# Patient Record
Sex: Female | Born: 1935 | Race: White | Hispanic: No | Marital: Married | Smoking: Never smoker
Health system: Southern US, Community
[De-identification: ages and names within clinical notes are randomized; demographics above are authoritative.]

## PROBLEM LIST (undated history)

## (undated) DIAGNOSIS — I10 Essential (primary) hypertension: Secondary | ICD-10-CM

## (undated) HISTORY — PX: WHIPPLE PROCEDURE: SHX2667

---

## 2014-04-27 DIAGNOSIS — K219 Gastro-esophageal reflux disease without esophagitis: Secondary | ICD-10-CM | POA: Insufficient documentation

## 2014-05-10 DIAGNOSIS — K5790 Diverticulosis of intestine, part unspecified, without perforation or abscess without bleeding: Secondary | ICD-10-CM | POA: Insufficient documentation

## 2014-05-17 DIAGNOSIS — G629 Polyneuropathy, unspecified: Secondary | ICD-10-CM | POA: Insufficient documentation

## 2014-05-28 DIAGNOSIS — C329 Malignant neoplasm of larynx, unspecified: Secondary | ICD-10-CM | POA: Insufficient documentation

## 2014-10-11 DIAGNOSIS — K589 Irritable bowel syndrome without diarrhea: Secondary | ICD-10-CM | POA: Insufficient documentation

## 2016-12-12 ENCOUNTER — Emergency Department (HOSPITAL_BASED_OUTPATIENT_CLINIC_OR_DEPARTMENT_OTHER)
Admission: EM | Admit: 2016-12-12 | Discharge: 2016-12-12 | Disposition: A | Discharge status: Home / Self Care | Payer: Medicare Other | Attending: Dermatology | Admitting: Dermatology

## 2016-12-12 ENCOUNTER — Encounter (HOSPITAL_BASED_OUTPATIENT_CLINIC_OR_DEPARTMENT_OTHER): Payer: Self-pay | Admitting: Emergency Medicine

## 2016-12-12 DIAGNOSIS — Z5321 Procedure and treatment not carried out due to patient leaving prior to being seen by health care provider: Secondary | ICD-10-CM | POA: Insufficient documentation

## 2016-12-12 DIAGNOSIS — K5900 Constipation, unspecified: Secondary | ICD-10-CM | POA: Insufficient documentation

## 2016-12-12 NOTE — ED Triage Notes (Signed)
Pt reports constipation since 2 days ago.  Pt denies abd pain. Pt took laxatives and stool softeners without relief.

## 2018-07-01 ENCOUNTER — Emergency Department (HOSPITAL_BASED_OUTPATIENT_CLINIC_OR_DEPARTMENT_OTHER)
Admission: EM | Admit: 2018-07-01 | Discharge: 2018-07-01 | Disposition: A | Discharge status: Home / Self Care | Payer: Medicare Other | Attending: Emergency Medicine | Admitting: Emergency Medicine

## 2018-07-01 ENCOUNTER — Other Ambulatory Visit: Payer: Self-pay

## 2018-07-01 ENCOUNTER — Emergency Department (HOSPITAL_BASED_OUTPATIENT_CLINIC_OR_DEPARTMENT_OTHER): Payer: Medicare Other

## 2018-07-01 ENCOUNTER — Encounter (HOSPITAL_BASED_OUTPATIENT_CLINIC_OR_DEPARTMENT_OTHER): Payer: Self-pay | Admitting: Emergency Medicine

## 2018-07-01 DIAGNOSIS — S0003XA Contusion of scalp, initial encounter: Secondary | ICD-10-CM | POA: Diagnosis not present

## 2018-07-01 DIAGNOSIS — I10 Essential (primary) hypertension: Secondary | ICD-10-CM | POA: Insufficient documentation

## 2018-07-01 DIAGNOSIS — W19XXXA Unspecified fall, initial encounter: Secondary | ICD-10-CM

## 2018-07-01 DIAGNOSIS — Y999 Unspecified external cause status: Secondary | ICD-10-CM | POA: Diagnosis not present

## 2018-07-01 DIAGNOSIS — Z23 Encounter for immunization: Secondary | ICD-10-CM | POA: Diagnosis not present

## 2018-07-01 DIAGNOSIS — S2232XA Fracture of one rib, left side, initial encounter for closed fracture: Secondary | ICD-10-CM | POA: Diagnosis not present

## 2018-07-01 DIAGNOSIS — R911 Solitary pulmonary nodule: Secondary | ICD-10-CM

## 2018-07-01 DIAGNOSIS — Y929 Unspecified place or not applicable: Secondary | ICD-10-CM | POA: Insufficient documentation

## 2018-07-01 DIAGNOSIS — Z79899 Other long term (current) drug therapy: Secondary | ICD-10-CM | POA: Insufficient documentation

## 2018-07-01 DIAGNOSIS — Y9389 Activity, other specified: Secondary | ICD-10-CM | POA: Insufficient documentation

## 2018-07-01 DIAGNOSIS — S0990XA Unspecified injury of head, initial encounter: Secondary | ICD-10-CM | POA: Diagnosis present

## 2018-07-01 DIAGNOSIS — W101XXA Fall (on)(from) sidewalk curb, initial encounter: Secondary | ICD-10-CM | POA: Diagnosis not present

## 2018-07-01 HISTORY — DX: Essential (primary) hypertension: I10

## 2018-07-01 MED ORDER — TETANUS-DIPHTH-ACELL PERTUSSIS 5-2.5-18.5 LF-MCG/0.5 IM SUSP
0.5000 mL | Freq: Once | INTRAMUSCULAR | Status: AC
  Administered 2018-07-01: 0.5 mL via INTRAMUSCULAR
  Filled 2018-07-01: qty 0.5

## 2018-07-01 NOTE — ED Triage Notes (Signed)
Pt was walking into restaurant and tripped on the curb.  Pt fell, did hit her head but no LOC.  Pt has hematoma and small laceration to head.  Skin tear to left arm.  Pt also c/o left rib soreness.

## 2018-07-01 NOTE — ED Notes (Signed)
Pt ambulated by herself without any issues

## 2018-07-01 NOTE — ED Provider Notes (Signed)
[facility] EMERGENCY DEPARTMENT Provider Note   CSN: [id number] Arrival date & time: 07/01/18  1109     History   Chief Complaint Chief Complaint  Patient presents with  . Fall    HPI Pam Boone is a 84 y.o. female.  The history is provided by the patient. No language interpreter was used.  Fall    Pam Boone is a 84 y.o. female who presents to the Emergency Department complaining of fall. She presents to the emergency department for evaluation of injuries following a mechanical fall. She is getting out of her car to go to breakfast when she tripped over the curb and fell forward, striking the left side of her head. She reports pain to the left side of her head. No loss of consciousness, nausea, vomiting. She also states that she thinks she hit her left chest in her left arm. She has a history of hypertension, does not take any anticoagulants. Past Medical History:  Diagnosis Date  . Hypertension     There are no active problems to display for this patient.   Past Surgical History:  Procedure Laterality Date  . WHIPPLE PROCEDURE       OB History   None      Home Medications    Prior to Admission medications   Medication Sig Start Date End Date Taking? Authorizing Provider  potassium chloride (K-DUR,KLOR-CON) 10 MEQ tablet Take by mouth. 05/15/17  Yes [provider]  buPROPion (WELLBUTRIN XL) 150 MG 24 hr tablet  05/12/18   [provider]  triamterene-hydrochlorothiazide (MAXZIDE-25) 37.5-25 MG tablet  06/09/18   [provider]    Family History No family history on file.  Social History Social History   Tobacco Use  . Smoking status: Never Smoker  . Smokeless tobacco: Never Used  Substance Use Topics  . Alcohol use: No  . Drug use: No     Allergies   Bactrim [sulfamethoxazole-trimethoprim]   Review of Systems Review of Systems  All other systems reviewed and are negative.    Physical Exam Updated  Vital Signs BP (!) 132/111 (BP Location: Right Arm)   Pulse 66   Temp 98.3 F (36.8 C)   Resp 20   Ht 5\' 2"  (1.575 m)   Wt 43.1 kg (95 lb)   SpO2 100%   BMI 17.38 kg/m   Physical Exam  Constitutional: She is oriented to person, place, and time. She appears well-developed and well-nourished.  HENT:  Head: Normocephalic.  Abrasion and hematoma to the left parietal scalp with local tenderness to palpation.    Cardiovascular: Normal rate and regular rhythm.  No murmur heard. Pulmonary/Chest: Effort normal and breath sounds normal. No respiratory distress.  Mild tenderness to palpation over left lower anteriolateral ribs without abrasion or ecchymosis.    Abdominal: Soft. There is no tenderness. There is no rebound and no guarding.  Musculoskeletal:  Skin tear to left lateral elbow without local tenderness, ROM intact at elbow, shoulder.    Neurological: She is alert and oriented to person, place, and time.  Skin: Skin is warm and dry.  Psychiatric: She has a normal mood and affect. Her behavior is normal.  Nursing note and vitals reviewed.    ED Treatments / Results  Labs (all labs ordered are listed, but only abnormal results are displayed) Labs Reviewed - No data to display  EKG None  Radiology Dg Ribs Unilateral W/chest Left  Result Date: 07/01/2018 CLINICAL DATA:  [place] today, now  with left rib pain EXAM: LEFT RIBS AND CHEST - 3+ VIEW COMPARISON:  None. FINDINGS: The lungs appear somewhat hyperaerated. Biapical pleural-parenchymal scarring is noted and there is a nodular opacity in the right mid upper lung of questionable significance. Comparison with prior or follow-up chest x-ray is recommended. If this persists then CT the chest may be warranted. Mediastinal and hilar contours are unremarkable. The heart is mildly enlarged. A small to moderate size hiatal hernia is present. Left rib detail films show possible fracture of the left anterior tenth rib. No other rib  abnormality is seen. IMPRESSION: 1. Possible nondisplaced fracture of the left anterior tenth rib. 2. Hyperaeration. Nodular opacity in the right upper mid lung. Recommend follow-up chest x-ray. 3. Small to moderate hiatal hernia. Electronically Signed   By: Ivar Drape M.D.   On: 07/01/2018 11:58   Ct Head Wo Contrast  Result Date: 07/01/2018 CLINICAL DATA:  Tripped and fell hitting the head, no loss of consciousness, small laceration and hematoma EXAM: CT HEAD WITHOUT CONTRAST TECHNIQUE: Contiguous axial images were obtained from the base of the skull through the vertex without intravenous contrast. COMPARISON:  None. FINDINGS: Brain: The ventricular system is prominent as are the cortical sulci, indicative of diffuse atrophy. The septum is midline in position. Fourth ventricle and basilar cisterns are unremarkable. Mild small vessel ischemic changes noted throughout the periventricular white matter. No hemorrhage, mass lesion, or acute infarction is seen. Vascular: No vascular abnormality is evident on this unenhanced study. Skull: On bone window images, there is scalp swelling over the left parietal region near the vertex. No underlying calvarial fracture is seen. Sinuses/Orbits: The paranasal sinuses appear well pneumatized. Other: None. IMPRESSION: 1. Mild small vessel ischemic change. No acute intracranial abnormality. 2. Small left parietal scalp hematoma.  No fracture. Electronically Signed   By: Ivar Drape M.D.   On: 07/01/2018 11:51    Procedures Procedures (including critical care time)  Medications Ordered in ED Medications  Tdap (BOOSTRIX) injection 0.5 mL (0.5 mLs Intramuscular Given 07/01/18 1301)     Initial Impression / Assessment and Plan / ED Course  I have reviewed the triage vital signs and the nursing notes.  Pertinent labs & imaging results that were available during my care of the patient were reviewed by me and considered in my medical decision making (see chart for  details).     Patient here for evaluation of injuries following a mechanical fall. She does have an abrasion to the left scalp, secondary to the arm. Imaging demonstrates no acute intracranial abnormality. No clinical evidence of fracture in the arm. She does have minimal left sided chest wall tenderness/pain. She has no splinting on respirations. Imaging does notes a nondisplaced rib fracture. Patient declines any pain medications in the department. Counseled patient on home care for injuries following a fall as well as rib fracture. Recommend Tylenol as needed for pain according to label instructions. Abdominal exam is benign, presentation is not consistent with significant intra-abdominal injury. Discussed return precautions and outpatient follow-up.  Final Clinical Impressions(s) / ED Diagnoses   Final diagnoses:  Fall, initial encounter  Contusion of scalp, initial encounter  Closed fracture of one rib of left side, initial encounter  Pulmonary nodule    ED Discharge Orders    None       Quintella Reichert, MD 07/01/18 1505

## 2018-07-01 NOTE — ED Notes (Signed)
Wound care completed by EMT Kayla per RN AMY

## 2020-01-01 ENCOUNTER — Ambulatory Visit: Payer: Medicare Other | Attending: Internal Medicine

## 2020-01-01 DIAGNOSIS — Z23 Encounter for immunization: Secondary | ICD-10-CM | POA: Insufficient documentation

## 2020-01-01 NOTE — Progress Notes (Signed)
   Covid-19 Vaccination Clinic  Name:  Pam Boone    MRN: [id number] DOB: 02/07/1933  01/01/2020  [name] was observed post Covid-19 immunization for 15 minutes without incidence. She was provided with Vaccine Information Sheet and instruction to access the V-Safe system.   [name] was instructed to call 911 with any severe reactions post vaccine: Marland Kitchen Difficulty breathing  . Swelling of your face and throat  . A fast heartbeat  . A bad rash all over your body  . Dizziness and weakness    Immunizations Administered    Name Date Dose VIS Date Route   Pfizer COVID-19 Vaccine 01/01/2020 10:07 AM 0.3 mL 11/20/2019 Intramuscular   Manufacturer: [place]   Lot: [id number]   [place]: [id number]

## 2020-01-19 ENCOUNTER — Other Ambulatory Visit: Payer: Self-pay

## 2020-01-19 ENCOUNTER — Emergency Department (HOSPITAL_BASED_OUTPATIENT_CLINIC_OR_DEPARTMENT_OTHER): Payer: Medicare Other

## 2020-01-19 ENCOUNTER — Encounter (HOSPITAL_BASED_OUTPATIENT_CLINIC_OR_DEPARTMENT_OTHER): Payer: Self-pay | Admitting: *Deleted

## 2020-01-19 ENCOUNTER — Emergency Department (HOSPITAL_BASED_OUTPATIENT_CLINIC_OR_DEPARTMENT_OTHER)
Admission: EM | Admit: 2020-01-19 | Discharge: 2020-01-19 | Disposition: A | Discharge status: Home / Self Care | Payer: Medicare Other | Attending: Emergency Medicine | Admitting: Emergency Medicine

## 2020-01-19 DIAGNOSIS — Z882 Allergy status to sulfonamides status: Secondary | ICD-10-CM | POA: Insufficient documentation

## 2020-01-19 DIAGNOSIS — I493 Ventricular premature depolarization: Secondary | ICD-10-CM | POA: Insufficient documentation

## 2020-01-19 DIAGNOSIS — Z79899 Other long term (current) drug therapy: Secondary | ICD-10-CM | POA: Diagnosis not present

## 2020-01-19 DIAGNOSIS — I10 Essential (primary) hypertension: Secondary | ICD-10-CM | POA: Insufficient documentation

## 2020-01-19 DIAGNOSIS — R0789 Other chest pain: Secondary | ICD-10-CM | POA: Diagnosis present

## 2020-01-19 DIAGNOSIS — R002 Palpitations: Secondary | ICD-10-CM

## 2020-01-19 LAB — BASIC METABOLIC PANEL
Anion gap: 9 (ref 5–15)
BUN: 12 mg/dL (ref 8–23)
CO2: 26 mmol/L (ref 22–32)
Calcium: 8.9 mg/dL (ref 8.9–10.3)
Chloride: 103 mmol/L (ref 98–111)
Creatinine, Ser: 0.79 mg/dL (ref 0.44–1.00)
GFR calc Af Amer: >60 mL/min (ref >60)
GFR calc non Af Amer: >60 mL/min (ref >60)
Glucose, Bld: 102 mg/dL — ABNORMAL HIGH (ref 70–99)
Potassium: 3.5 mmol/L (ref 3.5–5.1)
Sodium: 138 mmol/L (ref 135–145)

## 2020-01-19 LAB — CBC
HCT: 42.2 % (ref 36.0–46.0)
Hemoglobin: 13.8 g/dL (ref 12.0–15.0)
MCH: 31.4 pg (ref 26.0–34.0)
MCHC: 32.7 g/dL (ref 30.0–36.0)
MCV: 95.9 fL (ref 80.0–100.0)
Platelets: 255 10*3/uL (ref 150–400)
RBC: 4.4 MIL/uL (ref 3.87–5.11)
RDW: 12.7 % (ref 11.5–15.5)
WBC: 5.3 10*3/uL (ref 4.0–10.5)
nRBC: 0 % (ref 0.0–0.2)

## 2020-01-19 LAB — TROPONIN I (HIGH SENSITIVITY): Troponin I (High Sensitivity): 3 ng/L (ref <18)

## 2020-01-19 NOTE — ED Triage Notes (Signed)
Chest pain on and off for a while. States at times she feels like her heart is skipping a beat.

## 2020-01-19 NOTE — ED Notes (Signed)
Pt presents with "I feel like my heart is skipping beats", states does have some chest pain at left breast area, but not often.

## 2020-01-19 NOTE — Discharge Instructions (Signed)
Recommend following up both with your primary doctor and a cardiologist.  If you would like to stay within the [facility] system and in [facility], then you should contact your primary doctor to discuss placing referral.  If you would like to follow-up with a [facility] provider, then call the number provided.  Recommend discussing with your primary doctor and cardiologist obtaining heart monitor to further assess your heart rhythm.  The EKG today showed sinus rhythm (normal) with an occasional PVC (extra beat).  If you develop worsening chest pain, difficulty breathing, other new concerning symptom, return to ER for reassessment.

## 2020-01-19 NOTE — ED Provider Notes (Signed)
[facility] Provider Note   CSN: [id number] Arrival date & time: 01/19/20  1258     History Chief Complaint  Patient presents with  . Chest Pain    Pam Boone is a 86 y.o. female.  Presents to ER with complaint of sensation of heart skipping beats, palpitations.  States symptoms have been going on for the past month or 2.  Does not have frank pain but states she has a funny feeling in her heart, feels like her heart occasionally skips beats.  Seems to occur at random, not constant.  Not associated with exertion.  No association with shortness of breath.  Past medical history hypertension, denies prior history CAD.  Has not been evaluated by cardiology previously.  Called her primary doctor to discuss and was referred to ER.  HPI     Past Medical History:  Diagnosis Date  . Hypertension     There are no problems to display for this patient.   Past Surgical History:  Procedure Laterality Date  . WHIPPLE PROCEDURE       OB History   No obstetric history on file.     No family history on file.  Social History   Tobacco Use  . Smoking status: Never Smoker  . Smokeless tobacco: Never Used  Substance Use Topics  . Alcohol use: No  . Drug use: No    Home Medications Prior to Admission medications   Medication Sig Start Date End Date Taking? Authorizing Provider  buPROPion (WELLBUTRIN XL) 150 MG 24 hr tablet  05/12/18   [provider]  potassium chloride (K-DUR,KLOR-CON) 10 MEQ tablet Take by mouth. 05/15/17   [provider]  triamterene-hydrochlorothiazide Bradd Burner) 37.5-25 MG tablet  06/09/18   [provider]    Allergies    Bactrim [sulfamethoxazole-trimethoprim]  Review of Systems   Review of Systems  Constitutional: Negative for chills and fever.  HENT: Negative for ear pain and sore throat.   Eyes: Negative for pain and visual disturbance.  Respiratory: Negative for cough and shortness of  breath.   Cardiovascular: Positive for palpitations. Negative for chest pain.  Gastrointestinal: Negative for abdominal pain and vomiting.  Genitourinary: Negative for dysuria and hematuria.  Musculoskeletal: Negative for arthralgias and back pain.  Skin: Negative for color change and rash.  Neurological: Negative for seizures and syncope.  All other systems reviewed and are negative.   Physical Exam Updated Vital Signs BP 136/79   Pulse 68   Temp 98.5 F (36.9 C) (Oral)   Resp 18   Ht 5\' 1"  (1.549 m)   Wt 43.1 kg   SpO2 98%   BMI 17.95 kg/m   Physical Exam Vitals and nursing note reviewed.  Constitutional:      General: She is not in acute distress.    Appearance: She is well-developed.  HENT:     Head: Normocephalic and atraumatic.  Eyes:     Conjunctiva/sclera: Conjunctivae normal.  Cardiovascular:     Rate and Rhythm: Normal rate and regular rhythm.     Heart sounds: No murmur.  Pulmonary:     Effort: Pulmonary effort is normal. No respiratory distress.     Breath sounds: Normal breath sounds.  Abdominal:     Palpations: Abdomen is soft.     Tenderness: There is no abdominal tenderness.  Musculoskeletal:     Cervical back: Neck supple.     Right lower leg: No tenderness. No edema.     Left lower  leg: No tenderness. No edema.  Skin:    General: Skin is warm and dry.     Capillary Refill: Capillary refill takes less than 2 seconds.  Neurological:     Mental Status: She is alert.     ED Results / Procedures / Treatments   Labs (all labs ordered are listed, but only abnormal results are displayed) Labs Reviewed  BASIC METABOLIC PANEL - Abnormal; Notable for the following components:      Result Value   Glucose, Bld 102 (*)    All other components within normal limits  CBC  TROPONIN I (HIGH SENSITIVITY)    EKG EKG Interpretation  Date/Time:  Tuesday January 19 2020 13:24:33 EST Ventricular Rate:  82 PR Interval:    QRS Duration: 87 QT  Interval:  374 QTC Calculation: 432 R Axis:   44 Text Interpretation: Sinus rhythm Ventricular premature complex Low voltage, extremity and precordial leads Confirmed by Madalyn Rob [phone]) on 01/19/2020 2:03:59 PM   Radiology DG Chest 2 View  Result Date: 01/19/2020 CLINICAL DATA:  Chest pain. Shortness of breath and chest tightness for weeks. EXAM: CHEST - 2 VIEW COMPARISON:  Chest radiograph 06/10/2019, additional priors FINDINGS: Chronic hyperinflation. The chronic nodular density in the right mid/upper lung zone is partially obscured by overlying monitoring device in the current exam, but grossly stable. The heart is normal in size. Stable aortic tortuosity. Retrocardiac hiatal hernia. No acute airspace disease, pulmonary edema, pleural effusion or pneumothorax. Calcified granuloma in the left lung again seen. Mild biapical pleuroparenchymal scarring. IMPRESSION: 1. No acute abnormality. 2. Chronic hyperinflation. 3. Retrocardiac hiatal hernia. Electronically Signed   By: Keith Rake M.D.   On: 01/19/2020 13:53    Procedures Procedures (including critical care time)  Medications Ordered in ED Medications - No data to display  ED Course  I have reviewed the triage vital signs and the nursing notes.  Pertinent labs & imaging results that were available during my care of the patient were reviewed by me and considered in my medical decision making (see chart for details).    MDM Rules/Calculators/A&P                      86 year old lady presenting to ER with many weeks of intermittent palpitations/sensation heart skipping beat.  On exam patient noted to be very well-appearing, she had normal vital signs.  EKG demonstrated normal sinus rhythm with occasional PVC.  Electrolytes within normal limits, blood counts within normal limits, troponin within normal limits.  Given duration of symptoms, description of symptoms, EKG and troponin, highly doubt ACS.  Symptoms may be related to  her PVC.  Recommend that she follow-up with her primary doctor as well as a cardiologist.  Recommended discussing outpatient Holter monitor.  At this time believe she is appropriate for further management as outpatient, will discharge home.  Reviewed precautions.    After the discussed management above, the patient was determined to be safe for discharge.  The patient was in agreement with this plan and all questions regarding their care were answered.  ED return precautions were discussed and the patient will return to the ED with any significant worsening of condition.    Final Clinical Impression(s) / ED Diagnoses Final diagnoses:  Palpitations  PVC (premature ventricular contraction)    Rx / DC Orders ED Discharge Orders    None       Lucrezia Starch, MD 01/19/20 [phone]

## 2020-01-22 ENCOUNTER — Ambulatory Visit: Payer: Medicare Other | Attending: Internal Medicine

## 2020-01-22 DIAGNOSIS — Z23 Encounter for immunization: Secondary | ICD-10-CM | POA: Insufficient documentation

## 2020-01-22 NOTE — Progress Notes (Signed)
   Covid-19 Vaccination Clinic  Name:  Pam Boone    MRN: [id number] DOB: [birth year]  01/22/2020  Pam Boone was observed post Covid-19 immunization for 15 minutes without incidence. She was provided with Vaccine Information Sheet and instruction to access the V-Safe system.   Pam Boone was instructed to call 911 with any severe reactions post vaccine: Marland Kitchen Difficulty breathing  . Swelling of your face and throat  . A fast heartbeat  . A bad rash all over your body  . Dizziness and weakness    Immunizations Administered    Name Date Dose VIS Date Route   Pfizer COVID-19 Vaccine 01/22/2020 11:16 AM 0.3 mL 11/20/2019 Intramuscular   Manufacturer: [place]   Lot: [id number]   [place]: [id number]

## 2020-05-10 DIAGNOSIS — G301 Alzheimer's disease with late onset: Secondary | ICD-10-CM | POA: Insufficient documentation

## 2020-05-10 DIAGNOSIS — F0280 Dementia in other diseases classified elsewhere without behavioral disturbance: Secondary | ICD-10-CM | POA: Insufficient documentation

## 2020-09-26 ENCOUNTER — Emergency Department (HOSPITAL_BASED_OUTPATIENT_CLINIC_OR_DEPARTMENT_OTHER)
Admission: EM | Admit: 2020-09-26 | Discharge: 2020-09-26 | Disposition: A | Discharge status: Home / Self Care | Payer: Medicare Other | Attending: Emergency Medicine | Admitting: Emergency Medicine

## 2020-09-26 ENCOUNTER — Emergency Department (HOSPITAL_BASED_OUTPATIENT_CLINIC_OR_DEPARTMENT_OTHER): Payer: Medicare Other

## 2020-09-26 ENCOUNTER — Other Ambulatory Visit: Payer: Self-pay

## 2020-09-26 ENCOUNTER — Encounter (HOSPITAL_BASED_OUTPATIENT_CLINIC_OR_DEPARTMENT_OTHER): Payer: Self-pay | Admitting: Emergency Medicine

## 2020-09-26 DIAGNOSIS — R0789 Other chest pain: Secondary | ICD-10-CM | POA: Diagnosis present

## 2020-09-26 DIAGNOSIS — I10 Essential (primary) hypertension: Secondary | ICD-10-CM | POA: Diagnosis not present

## 2020-09-26 DIAGNOSIS — Z79899 Other long term (current) drug therapy: Secondary | ICD-10-CM | POA: Insufficient documentation

## 2020-09-26 DIAGNOSIS — R079 Chest pain, unspecified: Secondary | ICD-10-CM

## 2020-09-26 LAB — CBC
HCT: 38.1 % (ref 36.0–46.0)
Hemoglobin: 12.5 g/dL (ref 12.0–15.0)
MCH: 31.1 pg (ref 26.0–34.0)
MCHC: 32.8 g/dL (ref 30.0–36.0)
MCV: 94.8 fL (ref 80.0–100.0)
Platelets: 277 10*3/uL (ref 150–400)
RBC: 4.02 MIL/uL (ref 3.87–5.11)
RDW: 13.2 % (ref 11.5–15.5)
WBC: 5.4 10*3/uL (ref 4.0–10.5)
nRBC: 0 % (ref 0.0–0.2)

## 2020-09-26 LAB — COMPREHENSIVE METABOLIC PANEL
ALT: 14 U/L (ref 0–44)
AST: 19 U/L (ref 15–41)
Albumin: 4 g/dL (ref 3.5–5.0)
Alkaline Phosphatase: 106 U/L (ref 38–126)
Anion gap: 7 (ref 5–15)
BUN: 14 mg/dL (ref 8–23)
CO2: 26 mmol/L (ref 22–32)
Calcium: 8.7 mg/dL — ABNORMAL LOW (ref 8.9–10.3)
Chloride: 100 mmol/L (ref 98–111)
Creatinine, Ser: 0.74 mg/dL (ref 0.44–1.00)
GFR, Estimated: >60 mL/min (ref >60)
Glucose, Bld: 110 mg/dL — ABNORMAL HIGH (ref 70–99)
Potassium: 4.1 mmol/L (ref 3.5–5.1)
Sodium: 133 mmol/L — ABNORMAL LOW (ref 135–145)
Total Bilirubin: 0.5 mg/dL (ref 0.3–1.2)
Total Protein: 7.1 g/dL (ref 6.5–8.1)

## 2020-09-26 LAB — TROPONIN I (HIGH SENSITIVITY)
Troponin I (High Sensitivity): 3 ng/L (ref <18)
Troponin I (High Sensitivity): 4 ng/L (ref <18)

## 2020-09-26 NOTE — ED Triage Notes (Addendum)
Left sided Cp since Friday some sob  No n/v or radiation, pt states pain comes and goes

## 2020-09-26 NOTE — Discharge Instructions (Addendum)
Please follow-up with your cardiologist regarding the episode of chest pain that you are experiencing today.  If you develop worsening chest pain, difficulty in breathing or other new concerning symptom, return to ER for reassessment.

## 2020-09-26 NOTE — ED Provider Notes (Signed)
[facility] EMERGENCY DEPARTMENT Provider Note   CSN: [id number] Arrival date & time: 09/26/20  1539     History Chief Complaint  Patient presents with  . Chest Pain    Pam Boone is a 86 y.o. female.  Presents here with concern for chest pain.  Patient reports Friday she started having some intermittent chest pain, mild discomfort on left upper chest wall.  Nonradiating.  Symptoms were more mild on Saturday and Sunday, last had some mild pain at rest this morning.  No association with exertion, no difficulty in breathing.  Has history of hypertension, denies any other chronic medical conditions.  Denies prior history of CAD.  HPI     Past Medical History:  Diagnosis Date  . Hypertension     There are no problems to display for this patient.   Past Surgical History:  Procedure Laterality Date  . WHIPPLE PROCEDURE       OB History   No obstetric history on file.     No family history on file.  Social History   Tobacco Use  . Smoking status: Never Smoker  . Smokeless tobacco: Never Used  Substance Use Topics  . Alcohol use: No  . Drug use: No    Home Medications Prior to Admission medications   Medication Sig Start Date End Date Taking? Authorizing Provider  buPROPion (WELLBUTRIN XL) 150 MG 24 hr tablet  05/12/18   [provider]  potassium chloride (K-DUR,KLOR-CON) 10 MEQ tablet Take by mouth. 05/15/17   [provider]  triamterene-hydrochlorothiazide Bradd Burner) 37.5-25 MG tablet  06/09/18   [provider]    Allergies    Bactrim [sulfamethoxazole-trimethoprim]  Review of Systems   Review of Systems  Constitutional: Negative for chills and fever.  HENT: Negative for ear pain and sore throat.   Eyes: Negative for pain and visual disturbance.  Respiratory: Negative for cough and shortness of breath.   Cardiovascular: Positive for chest pain. Negative for palpitations.  Gastrointestinal: Negative for abdominal  pain and vomiting.  Genitourinary: Negative for dysuria and hematuria.  Musculoskeletal: Negative for arthralgias and back pain.  Skin: Negative for color change and rash.  Neurological: Negative for seizures and syncope.  All other systems reviewed and are negative.   Physical Exam Updated Vital Signs BP 109/65   Pulse 64   Temp 97.7 F (36.5 C) (Oral)   Resp 19   Ht 5' (1.524 m)   Wt 41.7 kg   SpO2 98%   BMI 17.97 kg/m   Physical Exam Vitals and nursing note reviewed.  Constitutional:      General: She is not in acute distress.    Appearance: She is well-developed.  HENT:     Head: Normocephalic and atraumatic.  Eyes:     Conjunctiva/sclera: Conjunctivae normal.  Cardiovascular:     Rate and Rhythm: Normal rate and regular rhythm.     Heart sounds: No murmur heard.   Pulmonary:     Effort: Pulmonary effort is normal. No respiratory distress.     Breath sounds: Normal breath sounds.  Chest:     Chest wall: No mass, deformity, tenderness or crepitus.  Abdominal:     Palpations: Abdomen is soft.     Tenderness: There is no abdominal tenderness.  Musculoskeletal:     Cervical back: Neck supple.  Skin:    General: Skin is warm and dry.     Capillary Refill: Capillary refill takes less than 2 seconds.  Neurological:  General: No focal deficit present.     Mental Status: She is alert and oriented to person, place, and time.     ED Results / Procedures / Treatments   Labs (all labs ordered are listed, but only abnormal results are displayed) Labs Reviewed  COMPREHENSIVE METABOLIC PANEL - Abnormal; Notable for the following components:      Result Value   Sodium 133 (*)    Glucose, Bld 110 (*)    Calcium 8.7 (*)    All other components within normal limits  CBC  TROPONIN I (HIGH SENSITIVITY)  TROPONIN I (HIGH SENSITIVITY)    EKG EKG Interpretation  Date/Time:  Monday September 26 2020 15:49:44 EDT Ventricular Rate:  72 PR Interval:  144 QRS  Duration: 62 QT Interval:  378 QTC Calculation: 413 R Axis:   51 Text Interpretation: Normal sinus rhythm Low voltage QRS Borderline ECG Confirmed by Madalyn Rob [phone]) on 09/26/2020 5:14:21 PM   Radiology DG Chest 2 View  Result Date: 09/26/2020 CLINICAL DATA:  Chest pain EXAM: CHEST - 2 VIEW COMPARISON:  01/19/2020 FINDINGS: The heart size and mediastinal contours are within normal limits. No consolidation. Similar nodular opacity in the right upper lobe. Chronic hyperinflation. No visible pleural effusions or pneumothorax. Biapical pleuroparenchymal scarring. Redemonstrated calcified granuloma in the left lung. Moderate hiatal hernia. No acute osseous abnormality. Degenerative changes of the spine. Osteopenia. IMPRESSION: 1. No acute cardiopulmonary abnormality. 2. Chronic hyperinflation. 3. Moderate hiatal hernia. Electronically Signed   By: Margaretha Sheffield MD   On: 09/26/2020 16:13    Procedures Procedures (including critical care time)  Medications Ordered in ED Medications - No data to display  ED Course  I have reviewed the triage vital signs and the nursing notes.  Pertinent labs & imaging results that were available during my care of the patient were reviewed by me and considered in my medical decision making (see chart for details).    MDM Rules/Calculators/A&P                           86 year old lady presents to ER with concern for chest pain.  On physical exam patient noted to be remarkably well-appearing with normal vital signs.  Her EKG is without acute ischemic changes, troponin x2 within normal limits, no ongoing pain.  Given these findings, very low suspicion for ACS.  Her chest x-ray per my review and per radiology has no acute findings.  Remainder of labs were grossly stable.  Recommend that she follow-up with her primary doctor regarding the symptoms and reviewed return precautions in detail with daughter and son at bedside.  After the discussed  management above, the patient was determined to be safe for discharge.  The patient was in agreement with this plan and all questions regarding their care were answered.  ED return precautions were discussed and the patient will return to the ED with any significant worsening of condition.  Final Clinical Impression(s) / ED Diagnoses Final diagnoses:  Chest pain, unspecified type    Rx / DC Orders ED Discharge Orders    None       Lucrezia Starch, MD 09/27/20 0002

## 2022-06-11 ENCOUNTER — Inpatient Hospital Stay (HOSPITAL_BASED_OUTPATIENT_CLINIC_OR_DEPARTMENT_OTHER)
Admission: EM | Admit: 2022-06-11 | Discharge: 2022-06-21 | DRG: 178 | Disposition: A | Discharge status: Skilled Nursing Facility | Payer: Medicare Other | Attending: Internal Medicine | Admitting: Internal Medicine

## 2022-06-11 ENCOUNTER — Encounter (HOSPITAL_BASED_OUTPATIENT_CLINIC_OR_DEPARTMENT_OTHER): Payer: Self-pay | Admitting: Pediatrics

## 2022-06-11 ENCOUNTER — Emergency Department (HOSPITAL_BASED_OUTPATIENT_CLINIC_OR_DEPARTMENT_OTHER): Payer: Medicare Other

## 2022-06-11 ENCOUNTER — Other Ambulatory Visit: Payer: Self-pay

## 2022-06-11 DIAGNOSIS — Z923 Personal history of irradiation: Secondary | ICD-10-CM

## 2022-06-11 DIAGNOSIS — E861 Hypovolemia: Secondary | ICD-10-CM | POA: Diagnosis present

## 2022-06-11 DIAGNOSIS — F0390 Unspecified dementia without behavioral disturbance: Secondary | ICD-10-CM | POA: Diagnosis present

## 2022-06-11 DIAGNOSIS — E876 Hypokalemia: Secondary | ICD-10-CM | POA: Diagnosis not present

## 2022-06-11 DIAGNOSIS — Z803 Family history of malignant neoplasm of breast: Secondary | ICD-10-CM

## 2022-06-11 DIAGNOSIS — Z888 Allergy status to other drugs, medicaments and biological substances status: Secondary | ICD-10-CM

## 2022-06-11 DIAGNOSIS — U071 COVID-19: Principal | ICD-10-CM | POA: Diagnosis present

## 2022-06-11 DIAGNOSIS — Z7189 Other specified counseling: Secondary | ICD-10-CM

## 2022-06-11 DIAGNOSIS — E872 Acidosis, unspecified: Secondary | ICD-10-CM | POA: Diagnosis present

## 2022-06-11 DIAGNOSIS — R1310 Dysphagia, unspecified: Secondary | ICD-10-CM | POA: Diagnosis present

## 2022-06-11 DIAGNOSIS — E871 Hypo-osmolality and hyponatremia: Secondary | ICD-10-CM | POA: Diagnosis present

## 2022-06-11 DIAGNOSIS — K219 Gastro-esophageal reflux disease without esophagitis: Secondary | ICD-10-CM | POA: Diagnosis present

## 2022-06-11 DIAGNOSIS — R627 Adult failure to thrive: Secondary | ICD-10-CM | POA: Diagnosis present

## 2022-06-11 DIAGNOSIS — I10 Essential (primary) hypertension: Secondary | ICD-10-CM | POA: Diagnosis present

## 2022-06-11 DIAGNOSIS — Z79899 Other long term (current) drug therapy: Secondary | ICD-10-CM

## 2022-06-11 DIAGNOSIS — E860 Dehydration: Secondary | ICD-10-CM | POA: Diagnosis not present

## 2022-06-11 DIAGNOSIS — Z515 Encounter for palliative care: Secondary | ICD-10-CM

## 2022-06-11 DIAGNOSIS — E785 Hyperlipidemia, unspecified: Secondary | ICD-10-CM | POA: Diagnosis present

## 2022-06-11 DIAGNOSIS — E440 Moderate protein-calorie malnutrition: Secondary | ICD-10-CM | POA: Diagnosis present

## 2022-06-11 DIAGNOSIS — Z85819 Personal history of malignant neoplasm of unspecified site of lip, oral cavity, and pharynx: Secondary | ICD-10-CM

## 2022-06-11 DIAGNOSIS — R531 Weakness: Secondary | ICD-10-CM

## 2022-06-11 DIAGNOSIS — Z681 Body mass index (BMI) 19 or less, adult: Secondary | ICD-10-CM

## 2022-06-11 DIAGNOSIS — Z66 Do not resuscitate: Secondary | ICD-10-CM | POA: Diagnosis present

## 2022-06-11 LAB — CBC
HCT: 40.5 % (ref 36.0–46.0)
Hemoglobin: 14 g/dL (ref 12.0–15.0)
MCH: 32.4 pg (ref 26.0–34.0)
MCHC: 34.6 g/dL (ref 30.0–36.0)
MCV: 93.8 fL (ref 80.0–100.0)
Platelets: 184 10*3/uL (ref 150–400)
RBC: 4.32 MIL/uL (ref 3.87–5.11)
RDW: 13.8 % (ref 11.5–15.5)
WBC: 8.3 10*3/uL (ref 4.0–10.5)
nRBC: 0 % (ref 0.0–0.2)

## 2022-06-11 LAB — URINALYSIS, ROUTINE W REFLEX MICROSCOPIC
Bilirubin Urine: NEGATIVE
Glucose, UA: >=500 mg/dL — AB
Ketones, ur: NEGATIVE mg/dL
Leukocytes,Ua: NEGATIVE
Nitrite: NEGATIVE
Protein, ur: 100 mg/dL — AB
Specific Gravity, Urine: 1.025 (ref 1.005–1.030)
pH: 6 (ref 5.0–8.0)

## 2022-06-11 LAB — URINALYSIS, MICROSCOPIC (REFLEX): WBC, UA: NONE SEEN WBC/hpf (ref 0–5)

## 2022-06-11 LAB — BASIC METABOLIC PANEL
Anion gap: 9 (ref 5–15)
BUN: 33 mg/dL — ABNORMAL HIGH (ref 8–23)
CO2: 21 mmol/L — ABNORMAL LOW (ref 22–32)
Calcium: 8.6 mg/dL — ABNORMAL LOW (ref 8.9–10.3)
Chloride: 102 mmol/L (ref 98–111)
Creatinine, Ser: 0.86 mg/dL (ref 0.44–1.00)
GFR, Estimated: >60 mL/min (ref >60)
Glucose, Bld: 258 mg/dL — ABNORMAL HIGH (ref 70–99)
Potassium: 3.9 mmol/L (ref 3.5–5.1)
Sodium: 132 mmol/L — ABNORMAL LOW (ref 135–145)

## 2022-06-11 LAB — RESP PANEL BY RT-PCR (FLU A&B, COVID) ARPGX2
Influenza A by PCR: NEGATIVE
Influenza B by PCR: NEGATIVE
SARS Coronavirus 2 by RT PCR: POSITIVE — AB

## 2022-06-11 LAB — LACTIC ACID, PLASMA
Lactic Acid, Venous: 2.6 mmol/L (ref 0.5–1.9)
Lactic Acid, Venous: 2.4 mmol/L (ref 0.5–1.9)

## 2022-06-11 LAB — HEPATIC FUNCTION PANEL
ALT: 17 U/L (ref 0–44)
AST: 27 U/L (ref 15–41)
Albumin: 4 g/dL (ref 3.5–5.0)
Alkaline Phosphatase: 72 U/L (ref 38–126)
Bilirubin, Direct: 0.2 mg/dL (ref 0.0–0.2)
Indirect Bilirubin: 0.7 mg/dL (ref 0.3–0.9)
Total Bilirubin: 0.9 mg/dL (ref 0.3–1.2)
Total Protein: 7.6 g/dL (ref 6.5–8.1)

## 2022-06-11 LAB — TROPONIN I (HIGH SENSITIVITY)
Troponin I (High Sensitivity): 7 ng/L (ref <18)
Troponin I (High Sensitivity): 8 ng/L (ref <18)

## 2022-06-11 LAB — CBG MONITORING, ED: Glucose-Capillary: 279 mg/dL — ABNORMAL HIGH (ref 70–99)

## 2022-06-11 LAB — LIPASE, BLOOD: Lipase: 49 U/L (ref 11–51)

## 2022-06-11 MED ORDER — ACETAMINOPHEN 500 MG PO TABS
1000.0000 mg | ORAL_TABLET | Freq: Once | ORAL | Status: AC
Start: 2022-06-11 — End: 2022-06-11
  Administered 2022-06-11: 1000 mg via ORAL
  Filled 2022-06-11: qty 2

## 2022-06-11 MED ORDER — LACTATED RINGERS IV SOLN: INTRAVENOUS | Status: AC

## 2022-06-11 MED ORDER — CEFEPIME HCL 1 G IJ SOLR: 1.0000 g | Freq: Two times a day (BID) | INTRAMUSCULAR | Status: DC

## 2022-06-11 MED ORDER — SODIUM CHLORIDE 0.9 % IV SOLN
2.0000 g | Freq: Once | INTRAVENOUS | Status: DC
  Filled 2022-06-11: qty 12.5

## 2022-06-11 MED ORDER — METRONIDAZOLE 500 MG/100ML IV SOLN
500.0000 mg | Freq: Once | INTRAVENOUS | Status: AC
  Administered 2022-06-11: 500 mg via INTRAVENOUS
  Filled 2022-06-11: qty 100

## 2022-06-11 MED ORDER — VANCOMYCIN HCL 750 MG/150ML IV SOLN
750.0000 mg | INTRAVENOUS | Status: DC
  Filled 2022-06-11: qty 150

## 2022-06-11 MED ORDER — SODIUM CHLORIDE 0.9 % IV SOLN
2.0000 g | INTRAVENOUS | Status: DC
  Administered 2022-06-11 – 2022-06-12 (×2): 2 g via INTRAVENOUS
  Filled 2022-06-11: qty 12.5

## 2022-06-11 MED ORDER — VANCOMYCIN HCL IN DEXTROSE 1-5 GM/200ML-% IV SOLN
1000.0000 mg | Freq: Once | INTRAVENOUS | Status: AC
  Administered 2022-06-11: 1000 mg via INTRAVENOUS
  Filled 2022-06-11: qty 200

## 2022-06-11 NOTE — ED Notes (Signed)
Lactic acid and repeat troponin obtained and to the lab

## 2022-06-11 NOTE — ED Notes (Signed)
Patient's family left, this RN will call if patient gets a bed assigned overnight.

## 2022-06-11 NOTE — ED Notes (Signed)
Patient's family member requests that patient NOT be treated for covid when admitted.

## 2022-06-11 NOTE — ED Provider Notes (Signed)
Emergency Department Provider Note   I have reviewed the triage vital signs and the nursing notes.   HISTORY  Chief Complaint Weakness   HPI Pam Boone is a 88 y.o. female with PMH of HTN and dementia with h/o throat cancer s/p radiation presents to the ED with fever, AMS, and cough. Patient has had significant decreased PO intake in the last week and is more confused. Until the last several weeks the patient has been able to live independently with assistance from daughter and son-in-law. Patient not vomiting. No CP or SOB. No diarrhea. Patient denies HA, CP, abdominal pain, back pain.    Past Medical History:  Diagnosis Date   Hypertension     Review of Systems  Constitutional: Positive fever/chills and decreased oral intake.  Cardiovascular: Denies chest pain. Respiratory: Denies shortness of breath. Positive cough.  Gastrointestinal: No abdominal pain. No nausea, no vomiting.  No diarrhea.  No constipation. Genitourinary: Negative for dysuria. Musculoskeletal: Negative for back pain. Skin: Negative for rash. Neurological: Negative for headaches, focal weakness or numbness.  ____________________________________________   PHYSICAL EXAM:  VITAL SIGNS: ED Triage Vitals  Enc Vitals Group     BP 06/11/22 1554 (!) 99/54     Pulse Rate 06/11/22 1554 88     Resp 06/11/22 1554 20     Temp 06/11/22 1554 98.6 F (37 C)     Temp Source 06/11/22 1554 Oral     SpO2 06/11/22 1554 100 %     Weight 06/11/22 1558 81 lb (36.7 kg)     Height 06/11/22 1558 '5\' 2"'$  (1.575 m)   Constitutional: Alert with increased confusion per family but oriented for me to person, place, and time.  Eyes: Conjunctivae are normal.  Head: Atraumatic. Nose: No congestion/rhinnorhea. Mouth/Throat: Mucous membranes are dry.  Neck: No stridor.  Cardiovascular: Normal rate, regular rhythm. Good peripheral circulation. Grossly normal heart sounds.   Respiratory: Normal respiratory effort.  No  retractions. Lungs CTAB. Frequent coughing on exam.  Gastrointestinal: Soft and nontender. No distention.  Musculoskeletal: No lower extremity tenderness nor edema. No gross deformities of extremities. Neurologic:  Normal speech and language. No gross focal neurologic deficits are appreciated.  Skin:  Skin is warm, dry and intact. No rash noted. ____________________________________________   LABS (all labs ordered are listed, but only abnormal results are displayed)  Labs Reviewed  RESP PANEL BY RT-PCR (FLU A&B, COVID) ARPGX2 - Abnormal; Notable for the following components:      Result Value   SARS Coronavirus 2 by RT PCR POSITIVE (*)    All other components within normal limits  BASIC METABOLIC PANEL - Abnormal; Notable for the following components:   Sodium 132 (*)    CO2 21 (*)    Glucose, Bld 258 (*)    BUN 33 (*)    Calcium 8.6 (*)    All other components within normal limits  URINALYSIS, ROUTINE W REFLEX MICROSCOPIC - Abnormal; Notable for the following components:   Glucose, UA >=500 (*)    Hgb urine dipstick TRACE (*)    Protein, ur 100 (*)    All other components within normal limits  LACTIC ACID, PLASMA - Abnormal; Notable for the following components:   Lactic Acid, Venous 2.6 (*)    All other components within normal limits  LACTIC ACID, PLASMA - Abnormal; Notable for the following components:   Lactic Acid, Venous 2.4 (*)    All other components within normal limits  URINALYSIS, MICROSCOPIC (REFLEX) - Abnormal;  Notable for the following components:   Bacteria, UA RARE (*)    All other components within normal limits  CBG MONITORING, ED - Abnormal; Notable for the following components:   Glucose-Capillary 279 (*)    All other components within normal limits  CULTURE, BLOOD (ROUTINE X 2)  CULTURE, BLOOD (ROUTINE X 2)  CBC  HEPATIC FUNCTION PANEL  LIPASE, BLOOD  PROTIME-INR  CBG MONITORING, ED  TROPONIN I (HIGH SENSITIVITY)  TROPONIN I (HIGH SENSITIVITY)    ____________________________________________  EKG   EKG Interpretation  Date/Time:  Monday June 11 2022 16:10:54 EDT Ventricular Rate:  89 PR Interval:  136 QRS Duration: 75 QT Interval:  340 QTC Calculation: 416 R Axis:   63 Text Interpretation: Sinus rhythm Borderline low voltage, extremity leads Confirmed by Nanda Quinton [phone]) on 06/11/2022 4:18:20 PM        ____________________________________________  RADIOLOGY  DG Chest Portable 1 View  Result Date: 06/11/2022 CLINICAL DATA:  Hypotension EXAM: PORTABLE CHEST 1 VIEW COMPARISON:  09/26/2020 FINDINGS: Heart size is normal. Chronic aortic tortuosity. Chronic lung disease with hyperinflation and scarring. No sign of active infiltrate, mass, effusion or collapse. IMPRESSION: No active disease. Chronic lung disease with hyperinflation and scarring. Electronically Signed   By: Nelson Chimes M.D.   On: 06/11/2022 16:36    ____________________________________________   PROCEDURES  Procedure(s) performed:   Procedures  CRITICAL CARE Performed by: Margette Fast Total critical care time: 35 minutes Critical care time was exclusive of separately billable procedures and treating other patients. Critical care was necessary to treat or prevent imminent or life-threatening deterioration. Critical care was time spent personally by me on the following activities: development of treatment plan with patient and/or surrogate as well as nursing, discussions with consultants, evaluation of patient's response to treatment, examination of patient, obtaining history from patient or surrogate, ordering and performing treatments and interventions, ordering and review of laboratory studies, ordering and review of radiographic studies, pulse oximetry and re-evaluation of patient's condition.  Nanda Quinton, MD Emergency Medicine  ____________________________________________   INITIAL IMPRESSION / ASSESSMENT AND PLAN / ED COURSE  Pertinent  labs & imaging results that were available during my care of the patient were reviewed by me and considered in my medical decision making (see chart for details).   This patient is Presenting for Evaluation of AMS, which does require a range of treatment options, and is a complaint that involves a high risk of morbidity and mortality.  The Differential Diagnoses includes but is not exclusive to alcohol, illicit or prescription medications, intracranial pathology such as stroke, intracerebral hemorrhage, fever or infectious causes including sepsis, hypoxemia, uremia, trauma, endocrine related disorders such as diabetes, hypoglycemia, thyroid-related diseases, etc.   Critical Interventions-    Medications  lactated ringers infusion ( Intravenous New Bag/Given 06/11/22 2022)  vancomycin (VANCOREADY) IVPB 750 mg/150 mL (has no administration in time range)  ceFEPIme (MAXIPIME) 2 g in sodium chloride 0.9 % 100 mL IVPB (2 g Intravenous New Bag/Given 06/11/22 1757)  acetaminophen (TYLENOL) tablet 1,000 mg (1,000 mg Oral Given 06/11/22 1650)  metroNIDAZOLE (FLAGYL) IVPB 500 mg (0 mg Intravenous Stopped 06/11/22 1754)  vancomycin (VANCOCIN) IVPB 1000 mg/200 mL premix (0 mg Intravenous Stopped 06/11/22 1754)    Reassessment after intervention: Patient is more alert.    I did obtain Additional Historical Information from son in law and daughter at bedside.   I decided to review pertinent External Data, and in summary patient's PCP with [facility]. Most recent labs  reviewed.   Clinical Laboratory Tests Ordered, included no leukocytosis but mild lactic acidosis.  UA not consistent with urinary tract infection and have sent this for culture along with blood cultures.  LFTs normal.  Troponin normal.  COVID PCR positive.  Flu negative.  Radiologic Tests Ordered, included CXR. I independently interpreted the images and agree with radiology interpretation.   Cardiac Monitor Tracing which shows sinus bradycardia.     Social Determinants of Health Risk patient is a non-smoker.   Consult complete with Hospitalist, Dr. Trilby Drummer. Plan for admit.   Medical Decision Making: Summary:  Patient presents emergency department with weakness and decreased oral intake.  She appears fairly unwell on arrival but improved with fever reduction and IV fluids.  No evidence of severe sepsis although lactic acid is elevated.  Have sent cultures and covered with antibiotics.  COVID testing is positive which could explain symptoms.   Reevaluation with update and discussion with patient, family, at bedside.  The patient's son-in-law, daughter, grandchildren expressed that they are very concerned about receiving antiviral medication for COVID.  We specifically discussed Paxlovid but patient is not hypoxic or having infiltrate on her chest x-ray.  After discussion with family, will defer this medication for now.  They prefer to admit for fluids and further monitoring.    Disposition: admit  ____________________________________________  FINAL CLINICAL IMPRESSION(S) / ED DIAGNOSES  Final diagnoses:  COVID-19  Generalized weakness  Dehydration  Lactic acidosis    Note:  This document was prepared using Dragon voice recognition software and may include unintentional dictation errors.  Nanda Quinton, MD, [facility] Emergency Medicine    Normajean Nash, W[facility], MD 06/11/22 2023

## 2022-06-11 NOTE — ED Triage Notes (Signed)
Reported altered mental status noticed yesterday; family reported patient usually drinks Boost on a regular basis and noticed have not been taking any oral intake;

## 2022-06-11 NOTE — ED Notes (Signed)
BC X 2 OBTAINED PRIOR TO ABX THERAPY °

## 2022-06-11 NOTE — ED Notes (Signed)
[name] [phone] first contact

## 2022-06-11 NOTE — ED Notes (Signed)
Patient visitors at bedside and patient updated on inpatient bed status.  Patient respirations even and unlabored with occasional weak cough.  Patient currently in NAD, able to eat 2 saltine crackers and drink some water.

## 2022-06-11 NOTE — ED Notes (Signed)
Brought in by family due to weakness, altered mental status and poor appetite. Upon entering room client to be unable to stand without total assistance to move to stretcher. Client very warm to touch. Noted to have congested cough, very foul smelling urine is also noted. Code Sepsis immediately implemented due to presentation, (tachycardia, tachypnea, AMS and fever)

## 2022-06-11 NOTE — Progress Notes (Addendum)
Pharmacy Antibiotic Note  Pam Boone is a 88 y.o. female admitted on 06/11/2022 with sepsis.  Cultures drawn before antibiotics given. Pharmacy has been consulted for cefepime/vancomycin dosing.  Lactic acid 2.6. Tmax 101.8. WBC wnl. Vancomycin 1g and metonidazole given in ED. Cefepmie also ordered. Note patient is only 37kg. ClCr 28 ml/min.   7/3 Vancomycin '750mg'$  Q 48 hr Scr used: 0.86 mg/dL Weight: 37.7 kg Vd coeff: 0.72 L/kg Est AUC: 500  Plan: Vancomycin '750mg'$  Q 48hr  Cefepime 2g q 24 hr  Monitor cultures, clinical status, renal function, vancomycin level Narrow abx as able and f/u duration    Height: '5\' 2"'$  (157.5 cm) Weight: 37.7 kg (83 lb 1.8 oz) IBW/kg (Calculated) : 50.1  Temp (24hrs), Avg:100.2 F (37.9 C), Min:98.6 F (37 C), Max:101.8 F (38.8 C)  Recent Labs  Lab 06/11/22 1613  WBC 8.3    CrCl cannot be calculated (Patient's most recent lab result is older than the maximum 21 days allowed.).    Allergies  Allergen Reactions   Bactrim [Sulfamethoxazole-Trimethoprim] Rash    Antimicrobials this admission: Vanc 7/3 >>  Cefe 7/3 >> MTZ 7/23   Dose adjustments this admission: N/a  Microbiology results: 7/3 BCx: sent 7/3 UCx: sent   Thank you for allowing pharmacy to be a part of this patient's care.  Benetta Spar, PharmD, BCPS, BCCP Clinical Pharmacist  Please check AMION for all [place] phone numbers After 10:00 PM, call [place] [phone]

## 2022-06-11 NOTE — ED Notes (Signed)
Patient given saltine crackers and ice water per family's request.

## 2022-06-12 DIAGNOSIS — Z515 Encounter for palliative care: Secondary | ICD-10-CM | POA: Diagnosis not present

## 2022-06-12 DIAGNOSIS — K219 Gastro-esophageal reflux disease without esophagitis: Secondary | ICD-10-CM | POA: Diagnosis not present

## 2022-06-12 DIAGNOSIS — E785 Hyperlipidemia, unspecified: Secondary | ICD-10-CM | POA: Diagnosis not present

## 2022-06-12 DIAGNOSIS — E871 Hypo-osmolality and hyponatremia: Secondary | ICD-10-CM | POA: Diagnosis not present

## 2022-06-12 DIAGNOSIS — E861 Hypovolemia: Secondary | ICD-10-CM | POA: Diagnosis not present

## 2022-06-12 DIAGNOSIS — U071 COVID-19: Principal | ICD-10-CM

## 2022-06-12 DIAGNOSIS — I10 Essential (primary) hypertension: Secondary | ICD-10-CM | POA: Diagnosis not present

## 2022-06-12 DIAGNOSIS — F0390 Unspecified dementia without behavioral disturbance: Secondary | ICD-10-CM | POA: Diagnosis not present

## 2022-06-12 DIAGNOSIS — E860 Dehydration: Secondary | ICD-10-CM | POA: Diagnosis present

## 2022-06-12 DIAGNOSIS — Z923 Personal history of irradiation: Secondary | ICD-10-CM | POA: Diagnosis not present

## 2022-06-12 DIAGNOSIS — Z79899 Other long term (current) drug therapy: Secondary | ICD-10-CM | POA: Diagnosis not present

## 2022-06-12 DIAGNOSIS — Z85819 Personal history of malignant neoplasm of unspecified site of lip, oral cavity, and pharynx: Secondary | ICD-10-CM | POA: Diagnosis not present

## 2022-06-12 DIAGNOSIS — Z803 Family history of malignant neoplasm of breast: Secondary | ICD-10-CM | POA: Diagnosis not present

## 2022-06-12 DIAGNOSIS — E876 Hypokalemia: Secondary | ICD-10-CM | POA: Diagnosis not present

## 2022-06-12 DIAGNOSIS — R1310 Dysphagia, unspecified: Secondary | ICD-10-CM | POA: Diagnosis not present

## 2022-06-12 DIAGNOSIS — R627 Adult failure to thrive: Secondary | ICD-10-CM | POA: Diagnosis not present

## 2022-06-12 DIAGNOSIS — Z66 Do not resuscitate: Secondary | ICD-10-CM | POA: Diagnosis not present

## 2022-06-12 DIAGNOSIS — Z888 Allergy status to other drugs, medicaments and biological substances status: Secondary | ICD-10-CM | POA: Diagnosis not present

## 2022-06-12 DIAGNOSIS — E872 Acidosis, unspecified: Secondary | ICD-10-CM | POA: Diagnosis not present

## 2022-06-12 DIAGNOSIS — Z681 Body mass index (BMI) 19 or less, adult: Secondary | ICD-10-CM | POA: Diagnosis not present

## 2022-06-12 DIAGNOSIS — E440 Moderate protein-calorie malnutrition: Secondary | ICD-10-CM | POA: Diagnosis not present

## 2022-06-12 LAB — CBC
HCT: 37.3 % (ref 36.0–46.0)
Hemoglobin: 13 g/dL (ref 12.0–15.0)
MCH: 32.4 pg (ref 26.0–34.0)
MCHC: 34.9 g/dL (ref 30.0–36.0)
MCV: 93 fL (ref 80.0–100.0)
Platelets: 149 10*3/uL — ABNORMAL LOW (ref 150–400)
RBC: 4.01 MIL/uL (ref 3.87–5.11)
RDW: 13.5 % (ref 11.5–15.5)
WBC: 5.5 10*3/uL (ref 4.0–10.5)
nRBC: 0 % (ref 0.0–0.2)

## 2022-06-12 LAB — GLUCOSE, CAPILLARY: Glucose-Capillary: 104 mg/dL — ABNORMAL HIGH (ref 70–99)

## 2022-06-12 LAB — PROTIME-INR
INR: 1.1 (ref 0.8–1.2)
Prothrombin Time: 13.6 seconds (ref 11.4–15.2)

## 2022-06-12 LAB — CREATININE, SERUM
Creatinine, Ser: 0.43 mg/dL — ABNORMAL LOW (ref 0.44–1.00)
GFR, Estimated: >60 mL/min (ref >60)

## 2022-06-12 MED ORDER — INSULIN ASPART 100 UNIT/ML IJ SOLN
0.0000 [IU] | Freq: Every day | INTRAMUSCULAR | Status: DC
  Administered 2022-06-14: 2 [IU] via SUBCUTANEOUS

## 2022-06-12 MED ORDER — ENOXAPARIN SODIUM 30 MG/0.3ML IJ SOSY
30.0000 mg | PREFILLED_SYRINGE | INTRAMUSCULAR | Status: DC
  Administered 2022-06-12 – 2022-06-20 (×9): 30 mg via SUBCUTANEOUS
  Filled 2022-06-12 (×9): qty 0.3

## 2022-06-12 MED ORDER — MELATONIN 5 MG PO TABS
5.0000 mg | ORAL_TABLET | Freq: Every evening | ORAL | Status: DC | PRN
  Administered 2022-06-13 – 2022-06-20 (×3): 5 mg via ORAL
  Filled 2022-06-12 (×3): qty 1

## 2022-06-12 MED ORDER — IPRATROPIUM-ALBUTEROL 0.5-2.5 (3) MG/3ML IN SOLN: 3.0000 mL | Freq: Four times a day (QID) | RESPIRATORY_TRACT | Status: DC

## 2022-06-12 MED ORDER — ASCORBIC ACID 500 MG PO TABS
250.0000 mg | ORAL_TABLET | Freq: Every day | ORAL | Status: DC
  Administered 2022-06-12 – 2022-06-21 (×9): 250 mg via ORAL
  Filled 2022-06-12 (×9): qty 1

## 2022-06-12 MED ORDER — INSULIN ASPART 100 UNIT/ML IJ SOLN
0.0000 [IU] | Freq: Three times a day (TID) | INTRAMUSCULAR | Status: DC
  Administered 2022-06-14: 1 [IU] via SUBCUTANEOUS
  Administered 2022-06-15: 2 [IU] via SUBCUTANEOUS
  Administered 2022-06-15: 3 [IU] via SUBCUTANEOUS
  Administered 2022-06-16 (×2): 2 [IU] via SUBCUTANEOUS
  Administered 2022-06-17: 1 [IU] via SUBCUTANEOUS
  Administered 2022-06-17 – 2022-06-18 (×2): 2 [IU] via SUBCUTANEOUS
  Administered 2022-06-19: 1 [IU] via SUBCUTANEOUS
  Administered 2022-06-19 – 2022-06-20 (×2): 2 [IU] via SUBCUTANEOUS
  Administered 2022-06-20: 3 [IU] via SUBCUTANEOUS
  Administered 2022-06-21: 1 [IU] via SUBCUTANEOUS

## 2022-06-12 MED ORDER — OYSTER SHELL CALCIUM/D3 500-5 MG-MCG PO TABS
1.0000 | ORAL_TABLET | Freq: Every day | ORAL | Status: DC
  Administered 2022-06-14 – 2022-06-21 (×8): 1 via ORAL
  Filled 2022-06-12 (×8): qty 1

## 2022-06-12 MED ORDER — POLYETHYLENE GLYCOL 3350 17 G PO PACK: 17.0000 g | PACK | Freq: Every day | ORAL | Status: DC | PRN

## 2022-06-12 MED ORDER — ZINC SULFATE 220 (50 ZN) MG PO CAPS
220.0000 mg | ORAL_CAPSULE | Freq: Every day | ORAL | Status: DC
  Administered 2022-06-12 – 2022-06-21 (×9): 220 mg via ORAL
  Filled 2022-06-12 (×9): qty 1

## 2022-06-12 MED ORDER — GUAIFENESIN-DM 100-10 MG/5ML PO SYRP
5.0000 mL | ORAL_SOLUTION | ORAL | Status: DC | PRN
Start: 2022-06-12 — End: 2022-06-21
  Administered 2022-06-13: 5 mL via ORAL
  Filled 2022-06-12: qty 5

## 2022-06-12 MED ORDER — ACETAMINOPHEN 325 MG PO TABS
650.0000 mg | ORAL_TABLET | Freq: Four times a day (QID) | ORAL | Status: DC | PRN
  Administered 2022-06-18: 650 mg via ORAL
  Filled 2022-06-12 (×2): qty 2

## 2022-06-12 MED ORDER — SODIUM CHLORIDE 0.9 % IV SOLN: INTRAVENOUS | Status: DC

## 2022-06-12 MED ORDER — IPRATROPIUM-ALBUTEROL 20-100 MCG/ACT IN AERS
1.0000 | INHALATION_SPRAY | Freq: Four times a day (QID) | RESPIRATORY_TRACT | Status: DC
  Filled 2022-06-12: qty 4

## 2022-06-12 MED ORDER — BOOST / RESOURCE BREEZE PO LIQD CUSTOM
1.0000 | Freq: Three times a day (TID) | ORAL | Status: DC
  Administered 2022-06-12 – 2022-06-13 (×2): 1 via ORAL

## 2022-06-12 NOTE — Progress Notes (Addendum)
The patient's family has declined antivirals.  Updated the patient's daughter, [name], via phone.  All questions answered to the best of my ability.

## 2022-06-12 NOTE — ED Notes (Signed)
Purewick canister full, emptied and replaced with new canister  Urine output of 1000cc

## 2022-06-12 NOTE — ED Notes (Signed)
This nurse and ED tech able to get patient to stand up at bedside to attempt ambulation. Patient leaning to left side and not able to take a few steps without 2 assist. Patient placed back in bed with new brief changed and purewick back in place. MD notified

## 2022-06-12 NOTE — ED Notes (Signed)
Patient resting on stretcher. Respirations even and unlabored.

## 2022-06-12 NOTE — ED Notes (Signed)
Patient able to take a sip of water, refused any more fluids PO, Patient brief changed with peri care performed. Patient denies other needs at this time

## 2022-06-12 NOTE — ED Notes (Addendum)
Patient repositioned in bed and seizure pads placed on rails d/t patient continuing to stick legs through railing.  Patient also given warm blanket per request. Patient continues to have intermittent dry coughing.

## 2022-06-12 NOTE — H&P (Signed)
History and Physical  Pam Boone IOX:[id number] DOB: 1933-09-20 DOA: 06/11/2022  Referring physician: Accepted by Dr. Trilby Drummer, [facility], hospitalist service. PCP: Cathlean Sauer, MD  Outpatient Specialists: None. Patient coming from: Home through [facility] ED  Chief Complaint: Confusion, fevers, dehydration  HPI: Pam Boone is a 88 y.o. female with medical history significant for dementia, essential hypertension, hyperlipidemia, moderate protein calorie malnutrition, vertigo, throat cancer s/p radiation, who presented to [facility] ED from home due to confusion, poor oral intake, and intermittent fevers.  She tested positive for COVID-19 in the ED.  Her chest x-ray was nonacute.  Not hypoxic.  However she was tachypneic and febrile with Tmax of 101.8.  Code sepsis was called in the ED.  Blood cultures were obtained and she was started on broad-spectrum IV antibiotics empirically, cefepime and IV vancomycin.  [facility], hospitalist service, was asked to admit.  The patient was accepted by Dr. Trilby Drummer, [facility], and transferred to [facility] long hospital telemetry unit as observation status.  The patient is unable to provide a history at the time of this visit due to underlying dementia.  She is alert, interactive, in no acute distress.  She denies having any pain.  States her appetite is poor.  ED Course: Tmax 101.8.  BP 135/62, pulse 98, respiratory rate 28, with saturation 99% on room air.  CBC essentially unremarkable.  Serum sodium 132, serum bicarb 21, glucose 258, BUN 33, creatinine 0.86, lactic acid 2.6, 2.4.  Review of Systems: Review of systems as noted in the HPI. All other systems reviewed and are negative.   Past Medical History:  Diagnosis Date   Hypertension    Past Surgical History:  Procedure Laterality Date   WHIPPLE PROCEDURE      Social History:  reports that she has never smoked. She has never used smokeless tobacco. She reports that she does not drink alcohol and does not use  drugs.   Allergies  Allergen Reactions   Bactrim [Sulfamethoxazole-Trimethoprim] Rash    Family history: Mother with history of cancer, unspecified. Father with history of cancer, and unspecified. Daughter, history of breast cancer.   Prior to Admission medications   Medication Sig Start Date End Date Taking? Authorizing Provider  buPROPion (WELLBUTRIN XL) 150 MG 24 hr tablet  05/12/18   [provider]  potassium chloride (K-DUR,KLOR-CON) 10 MEQ tablet Take by mouth. 05/15/17   [provider]  triamterene-hydrochlorothiazide Bradd Burner) 37.5-25 MG tablet  06/09/18   [provider]    Physical Exam: BP (!) 143/72 (BP Location: Right Arm)   Pulse 89   Temp 98.6 F (37 C) (Axillary)   Resp 17   Ht '5\' 2"'$  (1.575 m)   Wt 37.7 kg   SpO2 100%   BMI 15.20 kg/m   General: 87 y.o. year-old female well developed well nourished in no acute distress.  Alert and interactive. Cardiovascular: Regular rate and rhythm with no rubs or gallops.  No thyromegaly or JVD noted.  No lower extremity edema. 2/4 pulses in all 4 extremities. Respiratory: Mild rales at bases.  No wheezing noted.  Poor inspiratory effort. Abdomen: Soft nontender nondistended with normal bowel sounds x4 quadrants. Muskuloskeletal: No cyanosis, clubbing or edema noted bilaterally Neuro: CN II-XII intact, strength, sensation, reflexes Skin: No ulcerative lesions noted or rashes Psychiatry: Judgement and insight appear altered in the setting of dementia. Mood is appropriate for condition and setting          Labs on Admission:  Basic Metabolic Panel: Recent Labs  Lab 06/11/22 1613  NA 132*  K 3.9  CL 102  CO2 21*  GLUCOSE 258*  BUN 33*  CREATININE 0.86  CALCIUM 8.6*   Liver Function Tests: Recent Labs  Lab 06/11/22 1613  AST 27  ALT 17  ALKPHOS 72  BILITOT 0.9  PROT 7.6  ALBUMIN 4.0   Recent Labs  Lab 06/11/22 1613  LIPASE 49   No results for input(s): "AMMONIA" in the  last 168 hours. CBC: Recent Labs  Lab 06/11/22 1613  WBC 8.3  HGB 14.0  HCT 40.5  MCV 93.8  PLT 184   Cardiac Enzymes: No results for input(s): "CKTOTAL", "CKMB", "CKMBINDEX", "TROPONINI" in the last 168 hours.  BNP (last 3 results) No results for input(s): "BNP" in the last 8760 hours.  ProBNP (last 3 results) No results for input(s): "PROBNP" in the last 8760 hours.  CBG: Recent Labs  Lab 06/11/22 1552  GLUCAP 279*    Radiological Exams on Admission: DG Chest Portable 1 View  Result Date: 06/11/2022 CLINICAL DATA:  Hypotension EXAM: PORTABLE CHEST 1 VIEW COMPARISON:  09/26/2020 FINDINGS: Heart size is normal. Chronic aortic tortuosity. Chronic lung disease with hyperinflation and scarring. No sign of active infiltrate, mass, effusion or collapse. IMPRESSION: No active disease. Chronic lung disease with hyperinflation and scarring. Electronically Signed   By: Nelson Chimes M.D.   On: 06/11/2022 16:36    EKG: I independently viewed the EKG done and my findings are as followed: Sinus rhythm rate of 89.  Nonspecific ST-T changes.  QTc 416.  Assessment/Plan Present on Admission:  COVID-19 virus infection  Principal Problem:   COVID-19 virus infection  COVID-19 viral infection COVID-19 screening test positive on 06/11/2022 Airborne contact precautions in place Tmax 101.8 Not hypoxic Chest x-ray nonacute. Vitamin D3, C, zinc Bronchodilators, Combivent every 6 hours. Antitussives as needed Molnupiravir IV x5 days to avoid complications if okay with family. Obtain inflammatory markers in the morning, D-dimer, CRP, sed rate, ferritin.  Sepsis Tmax 101.8, RR 28, COVID-19 viral infection Code sepsis called in the ED due to COVID-19 viral infection Possible bacterial coinfection On cefepime and IV vancomycin Obtain procalcitonin level in the morning, if negative de-escalate antibiotics Continue gentle IV fluid hydration Lactic acid initially 2.6, downtrending to 2.4,  trend.  Hypovolemic hyponatremia Mild non-anion gap metabolic acidosis Serum sodium 132 and serum bicarb of 21 Gentle IV fluid hydration NS at 50 cc/h x 1 day  Hyperglycemia No reported history of diabetes Presented with serum glucose of 258 Obtain hemoglobin A1c Start insulin sliding scale  Severe protein calorie malnutrition BMI 15 Severe muscle mass loss Encourage oral intake. Added boost oral supplement    DVT prophylaxis: Subcu Lovenox daily  Code Status: Full code  Family Communication: None at bedside  Disposition Plan: Accepted and admitted to telemetry unit by Dr. Trilby Drummer as a direct transfer from [facility] ED on 06/11/2022.  Consults called: None.  Admission status: Observation status.   Status is: Observation    Kayleen Memos MD [facility] Pager [phone]  If 7PM-7AM, please contact night-coverage [email or web address] Password [email or web address]  06/12/2022, 7:50 PM

## 2022-06-12 NOTE — ED Notes (Signed)
Patient's son in law, [name] updated over the phone.

## 2022-06-13 ENCOUNTER — Inpatient Hospital Stay (HOSPITAL_COMMUNITY): Payer: Medicare Other

## 2022-06-13 DIAGNOSIS — R1310 Dysphagia, unspecified: Secondary | ICD-10-CM | POA: Diagnosis present

## 2022-06-13 DIAGNOSIS — Z66 Do not resuscitate: Secondary | ICD-10-CM | POA: Diagnosis present

## 2022-06-13 DIAGNOSIS — U071 COVID-19: Secondary | ICD-10-CM | POA: Diagnosis present

## 2022-06-13 DIAGNOSIS — E861 Hypovolemia: Secondary | ICD-10-CM | POA: Diagnosis present

## 2022-06-13 DIAGNOSIS — E860 Dehydration: Secondary | ICD-10-CM | POA: Diagnosis present

## 2022-06-13 DIAGNOSIS — E872 Acidosis, unspecified: Secondary | ICD-10-CM | POA: Diagnosis present

## 2022-06-13 DIAGNOSIS — Z79899 Other long term (current) drug therapy: Secondary | ICD-10-CM | POA: Diagnosis not present

## 2022-06-13 DIAGNOSIS — K219 Gastro-esophageal reflux disease without esophagitis: Secondary | ICD-10-CM | POA: Diagnosis present

## 2022-06-13 DIAGNOSIS — I10 Essential (primary) hypertension: Secondary | ICD-10-CM | POA: Diagnosis present

## 2022-06-13 DIAGNOSIS — Z7189 Other specified counseling: Secondary | ICD-10-CM | POA: Diagnosis not present

## 2022-06-13 DIAGNOSIS — E440 Moderate protein-calorie malnutrition: Secondary | ICD-10-CM | POA: Diagnosis present

## 2022-06-13 DIAGNOSIS — Z515 Encounter for palliative care: Secondary | ICD-10-CM | POA: Diagnosis not present

## 2022-06-13 DIAGNOSIS — E876 Hypokalemia: Secondary | ICD-10-CM | POA: Diagnosis not present

## 2022-06-13 DIAGNOSIS — R627 Adult failure to thrive: Secondary | ICD-10-CM | POA: Diagnosis present

## 2022-06-13 DIAGNOSIS — Z85819 Personal history of malignant neoplasm of unspecified site of lip, oral cavity, and pharynx: Secondary | ICD-10-CM | POA: Diagnosis not present

## 2022-06-13 DIAGNOSIS — E871 Hypo-osmolality and hyponatremia: Secondary | ICD-10-CM | POA: Diagnosis present

## 2022-06-13 DIAGNOSIS — Z888 Allergy status to other drugs, medicaments and biological substances status: Secondary | ICD-10-CM | POA: Diagnosis not present

## 2022-06-13 DIAGNOSIS — Z681 Body mass index (BMI) 19 or less, adult: Secondary | ICD-10-CM | POA: Diagnosis not present

## 2022-06-13 DIAGNOSIS — Z803 Family history of malignant neoplasm of breast: Secondary | ICD-10-CM | POA: Diagnosis not present

## 2022-06-13 DIAGNOSIS — Z923 Personal history of irradiation: Secondary | ICD-10-CM | POA: Diagnosis not present

## 2022-06-13 DIAGNOSIS — E785 Hyperlipidemia, unspecified: Secondary | ICD-10-CM | POA: Diagnosis present

## 2022-06-13 DIAGNOSIS — F0390 Unspecified dementia without behavioral disturbance: Secondary | ICD-10-CM | POA: Diagnosis present

## 2022-06-13 LAB — COMPREHENSIVE METABOLIC PANEL
ALT: 15 U/L (ref 0–44)
AST: 19 U/L (ref 15–41)
Albumin: 3.3 g/dL — ABNORMAL LOW (ref 3.5–5.0)
Alkaline Phosphatase: 54 U/L (ref 38–126)
Anion gap: 10 (ref 5–15)
BUN: 12 mg/dL (ref 8–23)
CO2: 24 mmol/L (ref 22–32)
Calcium: 8.3 mg/dL — ABNORMAL LOW (ref 8.9–10.3)
Chloride: 99 mmol/L (ref 98–111)
Creatinine, Ser: 0.44 mg/dL (ref 0.44–1.00)
GFR, Estimated: >60 mL/min (ref >60)
Glucose, Bld: 82 mg/dL (ref 70–99)
Potassium: 2.9 mmol/L — ABNORMAL LOW (ref 3.5–5.1)
Sodium: 133 mmol/L — ABNORMAL LOW (ref 135–145)
Total Bilirubin: 0.9 mg/dL (ref 0.3–1.2)
Total Protein: 6.2 g/dL — ABNORMAL LOW (ref 6.5–8.1)

## 2022-06-13 LAB — PROCALCITONIN: Procalcitonin: 0.21 ng/mL

## 2022-06-13 LAB — CBC WITH DIFFERENTIAL/PLATELET
Abs Immature Granulocytes: 0.02 10*3/uL (ref 0.00–0.07)
Basophils Absolute: 0 10*3/uL (ref 0.0–0.1)
Basophils Relative: 0 %
Eosinophils Absolute: 0 10*3/uL (ref 0.0–0.5)
Eosinophils Relative: 0 %
HCT: 38 % (ref 36.0–46.0)
Hemoglobin: 13.4 g/dL (ref 12.0–15.0)
Immature Granulocytes: 0 %
Lymphocytes Relative: 11 %
Lymphs Abs: 0.7 10*3/uL (ref 0.7–4.0)
MCH: 32.7 pg (ref 26.0–34.0)
MCHC: 35.3 g/dL (ref 30.0–36.0)
MCV: 92.7 fL (ref 80.0–100.0)
Monocytes Absolute: 0.5 10*3/uL (ref 0.1–1.0)
Monocytes Relative: 9 %
Neutro Abs: 4.9 10*3/uL (ref 1.7–7.7)
Neutrophils Relative %: 80 %
Platelets: 145 10*3/uL — ABNORMAL LOW (ref 150–400)
RBC: 4.1 MIL/uL (ref 3.87–5.11)
RDW: 13.6 % (ref 11.5–15.5)
WBC: 6.1 10*3/uL (ref 4.0–10.5)
nRBC: 0 % (ref 0.0–0.2)

## 2022-06-13 LAB — SEDIMENTATION RATE: Sed Rate: 11 mm/hr (ref 0–22)

## 2022-06-13 LAB — URINE CULTURE: Culture: NO GROWTH

## 2022-06-13 LAB — GLUCOSE, CAPILLARY
Glucose-Capillary: 79 mg/dL (ref 70–99)
Glucose-Capillary: 85 mg/dL (ref 70–99)
Glucose-Capillary: 103 mg/dL — ABNORMAL HIGH (ref 70–99)
Glucose-Capillary: 147 mg/dL — ABNORMAL HIGH (ref 70–99)

## 2022-06-13 LAB — HEMOGLOBIN A1C
Hgb A1c MFr Bld: 5.7 % — ABNORMAL HIGH (ref 4.8–5.6)
Mean Plasma Glucose: 116.89 mg/dL

## 2022-06-13 LAB — D-DIMER, QUANTITATIVE: D-Dimer, Quant: 1.76 ug/mL-FEU — ABNORMAL HIGH (ref 0.00–0.50)

## 2022-06-13 LAB — MAGNESIUM: Magnesium: 1.8 mg/dL (ref 1.7–2.4)

## 2022-06-13 LAB — PHOSPHORUS: Phosphorus: 2.8 mg/dL (ref 2.5–4.6)

## 2022-06-13 LAB — C-REACTIVE PROTEIN: CRP: 8 mg/dL — ABNORMAL HIGH (ref <1.0)

## 2022-06-13 LAB — LACTIC ACID, PLASMA: Lactic Acid, Venous: 1.2 mmol/L (ref 0.5–1.9)

## 2022-06-13 LAB — FERRITIN: Ferritin: 101 ng/mL (ref 11–307)

## 2022-06-13 MED ORDER — FOOD THICKENER (SIMPLYTHICK)
1.0000 | ORAL | Status: DC | PRN
Start: 2022-06-13 — End: 2022-06-21

## 2022-06-13 MED ORDER — SODIUM CHLORIDE 0.9 % IV SOLN
INTRAVENOUS | Status: DC
Start: 2022-06-13 — End: 2022-06-14

## 2022-06-13 MED ORDER — ROSUVASTATIN CALCIUM 5 MG PO TABS
5.0000 mg | ORAL_TABLET | Freq: Every day | ORAL | Status: DC
  Administered 2022-06-14 – 2022-06-21 (×8): 5 mg via ORAL
  Filled 2022-06-13 (×8): qty 1

## 2022-06-13 MED ORDER — POTASSIUM CHLORIDE CRYS ER 20 MEQ PO TBCR: 40.0000 meq | EXTENDED_RELEASE_TABLET | ORAL | Status: DC

## 2022-06-13 MED ORDER — IPRATROPIUM-ALBUTEROL 0.5-2.5 (3) MG/3ML IN SOLN
3.0000 mL | Freq: Four times a day (QID) | RESPIRATORY_TRACT | Status: DC
  Administered 2022-06-13: 3 mL via RESPIRATORY_TRACT
  Filled 2022-06-13: qty 3

## 2022-06-13 MED ORDER — IPRATROPIUM-ALBUTEROL 0.5-2.5 (3) MG/3ML IN SOLN: 3.0000 mL | Freq: Four times a day (QID) | RESPIRATORY_TRACT | Status: DC | PRN

## 2022-06-13 MED ORDER — MAGNESIUM SULFATE 2 GM/50ML IV SOLN
2.0000 g | Freq: Once | INTRAVENOUS | Status: AC
  Administered 2022-06-13: 2 g via INTRAVENOUS
  Filled 2022-06-13: qty 50

## 2022-06-13 MED ORDER — POTASSIUM CHLORIDE 10 MEQ/100ML IV SOLN
10.0000 meq | INTRAVENOUS | Status: AC
  Administered 2022-06-13 (×6): 10 meq via INTRAVENOUS
  Filled 2022-06-13 (×5): qty 100

## 2022-06-13 MED ORDER — PANTOPRAZOLE SODIUM 40 MG PO TBEC
80.0000 mg | DELAYED_RELEASE_TABLET | Freq: Every day | ORAL | Status: DC
  Administered 2022-06-14 – 2022-06-21 (×8): 80 mg via ORAL
  Filled 2022-06-13 (×8): qty 2

## 2022-06-13 MED ORDER — DONEPEZIL HCL 10 MG PO TABS
10.0000 mg | ORAL_TABLET | Freq: Every day | ORAL | Status: DC
  Administered 2022-06-13 – 2022-06-20 (×8): 10 mg via ORAL
  Filled 2022-06-13 (×8): qty 1

## 2022-06-13 MED ORDER — MEGESTROL ACETATE 400 MG/10ML PO SUSP
200.0000 mg | Freq: Two times a day (BID) | ORAL | Status: DC
  Administered 2022-06-13 – 2022-06-21 (×16): 200 mg via ORAL
  Filled 2022-06-13 (×17): qty 10

## 2022-06-13 MED ORDER — VITAMIN B-12 1000 MCG PO TABS
1000.0000 ug | ORAL_TABLET | Freq: Every day | ORAL | Status: DC
  Administered 2022-06-14 – 2022-06-21 (×8): 1000 ug via ORAL
  Filled 2022-06-13 (×7): qty 1

## 2022-06-13 NOTE — TOC Initial Note (Signed)
Transition of Care [facility]) - Initial/Assessment Note    Patient Details  Name: Pam Boone MRN: [id number] Date of Birth: 1933-11-19  Transition [facility] Care [facility]) CM/SW Contact:    Vassie Moselle, LCSW Phone Number: 06/13/2022, 1:02 PM  Clinical Narrative:                 Met with pt and family at bedside and confirmed plan for SNF placement. Pt's daughter, [name] will be main point of contact for this pt. Pt's family has been working with [facility] to get this pt into their facility for long term care and have a meeting with their admissions coordinator, Alice on Monday 0/92/33. Pt's family is open to this pt going to SNF prior to transitioning to LTC. Pt has been referred out for SNF placement and currently awaiting bed offers. Palliative care has also been consulted for this pt and CSW will continue to follow for further recommendations and discharge needs.   Expected Discharge Plan: Skilled Nursing Facility Barriers to Discharge: Continued Medical Work up   Patient Goals and CMS Choice Patient states their goals for this hospitalization and ongoing recovery are:: To return home   Choice offered to / list presented to : Patient, Adult Children  Expected Discharge Plan and Services Expected Discharge Plan: [place] In-house Referral: Clinical Social Work, Hospice / [place] Discharge Planning Services: CM Consult Post Acute Care Choice: [place] Living arrangements for the past 2 months: Single Family Home                 DME Arranged: N/A DME Agency: NA                  Prior Living Arrangements/Services Living arrangements for the past 2 months: Single Family Home Lives with:: Self Patient language and need for interpreter reviewed:: Yes Do you feel safe going back to the place where you live?: Yes      Need for Family Participation in Patient Care: Yes (Comment) Care giver support system in place?: No (comment) Current  home services: DME Criminal Activity/Legal Involvement Pertinent to Current Situation/Hospitalization: No - Comment as needed  Activities of Daily Living Home Assistive Devices/Equipment: Cane (specify quad or straight) ADL Screening (condition at time of admission) Patient's cognitive ability adequate to safely complete daily activities?: No Is the patient deaf or have difficulty hearing?: Yes Does the patient have difficulty seeing, even when wearing glasses/contacts?: Yes Does the patient have difficulty concentrating, remembering, or making decisions?: Yes Patient able to express need for assistance with ADLs?: No Does the patient have difficulty dressing or bathing?: Yes Independently performs ADLs?: No Communication: Independent Dressing (OT): Needs assistance Is this a change from baseline?: Change from baseline, expected to last >3 days Grooming: Needs assistance Is this a change from baseline?: Change from baseline, expected to last >3 days Feeding: Needs assistance Is this a change from baseline?: Change from baseline, expected to last >3 days Bathing: Needs assistance Is this a change from baseline?: Change from baseline, expected to last >3 days Toileting: Needs assistance Is this a change from baseline?: Change from baseline, expected to last >3days In/Out Bed: Needs assistance Is this a change from baseline?: Change from baseline, expected to last >3 days Walks in Home: Needs assistance Is this a change from baseline?: Change from baseline, expected to last >3 days Does the patient have difficulty walking or climbing stairs?: Yes Weakness of Legs: Both Weakness of Arms/Hands: None  Permission  Sought/Granted Permission sought to share information with : Facility Sport and exercise psychologist, Family Supports Permission granted to share information with : Yes, Verbal Permission Granted  Share Information with NAME: [place] granted to share info w  Relationship: Daughter  Permission granted to share info w Contact Information: [phone]  Emotional Assessment Appearance:: Appears stated age Attitude/Demeanor/Rapport: Lethargic Affect (typically observed): Blunt Orientation: : Oriented to Self Alcohol / Substance Use: Not Applicable Psych Involvement: No (comment)  Admission diagnosis:  Dehydration [E86.0] Lactic acidosis [E87.20] Generalized weakness [R53.1] COVID-19 virus infection [U07.1] COVID-19 [U07.1] Patient Active Problem List   Diagnosis Date Noted   COVID-19 virus infection 06/11/2022   Late onset Alzheimer's dementia without behavioral disturbance ([place]) 05/10/2020   Irritable bowel syndrome 10/11/2014   Malignant neoplasm of larynx ([place]) 05/28/2014   Peripheral neuropathy 05/17/2014   Diverticulosis 05/10/2014   Gastroesophageal reflux disease without esophagitis 04/27/2014   PCP:  Cathlean Sauer, MD Pharmacy:   [id number] - [place], [address] [place] [address] Phone: [phone] Fax: [phone]     Social Determinants of Health (SDOH) Interventions    Readmission Risk Interventions     No data to display

## 2022-06-13 NOTE — Evaluation (Signed)
Clinical/Bedside Swallow Evaluation Patient Details  Name: Pam Boone MRN: [id number] Date of Birth: 02-10-1933  Today's Date: 06/13/2022 Time: SLP Start Time (ACUTE ONLY): 1016 SLP Stop Time (ACUTE ONLY): 1105 SLP Time Calculation (min) (ACUTE ONLY): 49 min  Past Medical History:  Past Medical History:  Diagnosis Date   Hypertension    Past Surgical History:  Past Surgical History:  Procedure Laterality Date   WHIPPLE PROCEDURE     HPI:  Pam Boone is a 88 y.o. female with medical history significant for dementia, essential hypertension, hyperlipidemia, moderate protein calorie malnutrition, vertigo, throat cancer s/p radiation, who presented to [facility] ED from home due to confusion, poor oral intake, and intermittent fevers.  She tested positive for COVID-19 in the ED.  Her chest x-ray was nonacute.  .  However she was tachypneic and febrile with Tmax of 101.8.  Code sepsis was called in the ED.  Blood cultures were obtained and she was started on broad-spectrum IV antibiotics empirically, cefepime and IV vancomycin. Son-in-law present and advised that pt has had 8 pound weight loss over the last 3 weeks - and was not able to walk to bathroom 2 days ago.  Swallow evaluation ordered.  Per son-in-law, pt chronically clears her throat when she eats/drinks and has had decreased intake.  She is followed by oncology at [facility]. 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  Pt also with h/o Nissen per son.    Assessment / Plan / Recommendation  Clinical Impression  Patient currently presents with clinical indications concerning for pharyngeal-cervical esophageal dysphagia.  Chin superior extension observed - neck tissue did not appear severely fibrotic/stiff.  Upon entrance to room, pt alert with son-in-law present.  Provided pt with po trials including ice chips, nectar  thick liquid and thin water.  Multiple swallows observed across consistencies with immediate and delayed throat clearing and reflexive cough that was not productive.  Wet vocal quality noted that pt does not clear as well.  Clinically judged impaired laryngeal elevation/closure noted.  Suspect pt with severe pharyngeal=cervical esophageal retention and at least laryngeal penetration. Son-in-law reports progressive weight loss including 8 pounds in 3 weeks causing SLP to be concerned that her dysphagia is impacting her intake. Pt denies dysphagia - however could be insidious process given suspected chronic nature.   Discussed head and neck cancer, radiation treatment potential side effects that may be exacerbated at this time due to deconditioning/FTT. Discussed option of conducting MBS to allow instrumental view of swallowing and that SlP doubts conducting this test will change pt's outcomes.  Pam Boone and pt advise to proceed therefore will attempt to conduct MBS today.  Reviewed with pt and son-in-law concern for risk factors for impaired tolerance of suspected chronic aspiration including FTT, deconditioning, weak cough, etc and reviewed burden of long term feeding tubes outweigh benefit with people over the age of 51 with cognitive deficits.  Given pt chronically clears her throat with intake per son-in-law and pt reports on occasion coughing up food - suspect acute on chronic dysphagia.  Recommend small amounts of clear liquids pending MBS. Also strengthening tongue base retraction/expectoration ability may be helpful.   Thankful palliative care engaged for this pt and family and anticipate "comfort po" may be optimal care plan for this pt. SLP Visit Diagnosis: Dysphagia, pharyngeal phase (R13.13);Dysphagia, pharyngoesophageal phase (R13.14);Dysphagia, unspecified (R13.10)    Aspiration Risk  Risk  for inadequate nutrition/hydration;Severe aspiration risk    Diet Recommendation Thin liquid (small amounts of  clears pending MBS)   Liquid Administration via: Spoon Medication Administration: Via alternative means Compensations: Slow rate;Small sips/bites;Multiple dry swallows after each bite/sip Postural Changes: Seated upright at 90 degrees;Remain upright for at least 30 minutes after po intake    Other  Recommendations Oral Care Recommendations: Oral care BID Other Recommendations: Have oral suction available    Recommendations for follow up therapy are one component of a multi-disciplinary discharge planning process, led by the attending physician.  Recommendations may be updated based on patient status, additional functional criteria and insurance authorization.  Follow up Recommendations Follow physician's recommendations for discharge plan and follow up therapies      Assistance Recommended at Discharge Frequent or constant Supervision/Assistance  Functional Status Assessment Patient has had a recent decline in their functional status and/or demonstrates limited ability to make significant improvements in function in a reasonable and predictable amount of time  Frequency and Duration      TBD      Prognosis Prognosis for Safe Diet Advancement: Guarded Barriers to Reach Goals: Severity of deficits;Time post onset      Swallow Study   General Date of Onset: 06/13/22 HPI: Pam Boone is a 88 y.o. female with medical history significant for dementia, essential hypertension, hyperlipidemia, moderate protein calorie malnutrition, vertigo, throat cancer s/p radiation, who presented to [facility] ED from home due to confusion, poor oral intake, and intermittent fevers.  She tested positive for COVID-19 in the ED.  Her chest x-ray was nonacute.  .  However she was tachypneic and febrile with Tmax of 101.8.  Code sepsis was called in the ED.  Blood cultures were obtained and she was started on broad-spectrum IV antibiotics empirically, cefepime and IV vancomycin. Son-in-law present and advised that pt  has had 8 pound weight loss over the last 3 weeks - and was not able to walk to bathroom 2 days ago.  Swallow evaluation ordered.  Per son-in-law, pt chronically clears her throat when she eats/drinks and has had decreased intake.  She is followed by oncology at [facility]. 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  Pt also with h/o Nissen per son. Type of Study: Bedside Swallow Evaluation Previous Swallow Assessment: none in chart Diet Prior to this Study: Regular;Thin liquids Temperature Spikes Noted: No Respiratory Status: Room air History of Recent Intubation: No Behavior/Cognition: Alert;Cooperative;Pleasant mood Oral Cavity Assessment: Dry Oral Care Completed by SLP: No Oral Cavity - Dentition: Other (Comment) (partials) Vision: Functional for self-feeding Self-Feeding Abilities: Needs assist Patient Positioning: Upright in bed Baseline Vocal Quality: Low vocal intensity;Hoarse;Breathy Volitional Cough: Weak;Other (Comment) (not productive) Volitional Swallow: Able to elicit (impaired laryngeal elevation clinically observed.)    Oral/Motor/Sensory Function Overall Oral Motor/Sensory Function: Within functional limits   Ice Chips Ice chips: Not tested   Thin Liquid Thin Liquid: Impaired Presentation: Self Fed;Straw Pharyngeal  Phase Impairments: Multiple swallows;Throat Clearing - Immediate;Decreased hyoid-laryngeal movement;Throat Clearing - Delayed;Cough - Delayed    Nectar Thick Nectar Thick Liquid: Impaired Presentation: Straw;Spoon;Self Fed Pharyngeal Phase Impairments: Decreased hyoid-laryngeal movement;Multiple swallows;Throat Clearing - Immediate;Throat Clearing - Delayed;Cough - Delayed   Honey Thick Honey Thick Liquid: Not tested   Puree Puree: Not tested   Solid     Solid: Not tested      Pam Boone 06/13/2022,11:32 AM  Tammy  Raliegh Ip, MS  [facility] SLP Acute Rehab Services Office [phone] Pager [phone]

## 2022-06-13 NOTE — Progress Notes (Signed)
PT Cancellation Note  Patient Details Name: Pam Boone MRN: [id number] DOB: 10-Oct-1933   Cancelled Treatment:    Reason Eval/Treat Not Completed: Patient at procedure or test/unavailable--with speech-swallow test. Will check back another day/time.    [place] Acute Rehabilitation  Office: [phone] Pager: [phone]

## 2022-06-13 NOTE — Evaluation (Addendum)
Occupational Therapy Evaluation Patient Details Name: Pam Boone MRN: [id number] DOB: 1933-03-14 Today's Date: 06/13/2022   History of Present Illness Karelyn Trine is a 88 y.o. female with past medical history significant for dementia, essential hypertension, HLD, moderate protein calorie malnutrition, vertigo, throat cancer s/p radiation who initially presented to [facility] ED from home due to confusion, poor oral intake and intermittent fevers.  Family reports that she lives alone but they check on her occasionally throughout the week. They were planning on moving her to an assisted living facility [facility]) in the near future.  They noted that she has not been eating or drinking well over the last 2 weeks, was prior utilizing boost shakes. Found to be COVID positive and admitted.   Clinical Impression   Mrs. Aalaya Yadao is an 88 year old woman with a history of dementia that has been living alone with intermittent assistance from family up until now. Family has been working on getting her placed in an ALF. She typically does not use a device with ambulation and she has been independent with daily tasks. She has assistance for IADLs. On evaluation she presents with generalized weakness, decreased activity tolerance and impaired balance. Today she requires mod assist to stand and mod-max assist for LB ADLs and toileting. She was unsteady at the side of the bed in sitting and in standing needing physical assistance. She needed assistance for bathing, dressing and toileting. Patient will benefit from skilled OT services while in hospital to improve deficits and learn compensatory strategies as needed in order to return to PLOF.  Recommend short term rehab at discharge to maximize patient's physical abilities and then bridge to ALF.      Recommendations for follow up therapy are one component of a multi-disciplinary discharge planning process, led by the attending physician.  Recommendations may be  updated based on patient status, additional functional criteria and insurance authorization.   Follow Up Recommendations  Skilled nursing-short term rehab (<3 hours/day)    Assistance Recommended at Discharge Frequent or constant Supervision/Assistance  Patient can return home with the following A lot of help with walking and/or transfers;A lot of help with bathing/dressing/bathroom;Assistance with cooking/housework;Help with stairs or ramp for entrance;Direct supervision/assist for medications management;Direct supervision/assist for financial management;Assist for transportation    Functional Status Assessment  Patient has had a recent decline in their functional status and demonstrates the ability to make significant improvements in function in a reasonable and predictable amount of time.  Equipment Recommendations  Other (comment) (Defer to Next venue)    Recommendations for Other Services       Precautions / Restrictions Precautions Precautions: Fall Restrictions Weight Bearing Restrictions: No      Mobility Bed Mobility Overal bed mobility: Needs Assistance Bed Mobility: Supine to Sit, Sit to Supine     Supine to sit: Min assist, HOB elevated Sit to supine: Mod assist   General bed mobility comments: Min assist for transfer in to sitting and mod assist for both LEs to return to supine.    Transfers Overall transfer level: Needs assistance   Transfers: Sit to/from Stand Sit to Stand: Mod assist           General transfer comment: Mod assist to power up to stand at side of bed for functional task. Used both hands on bed rail to turn but otherwise no actual ambulation.      Balance Overall balance assessment: Needs assistance Sitting-balance support: No upper extremity supported Sitting balance-Leahy Scale: Poor  Standing balance support: During functional activity Standing balance-Leahy Scale: Poor                             ADL either  performed or assessed with clinical judgement   ADL Overall ADL's : Needs assistance/impaired Eating/Feeding: Set up;Sitting   Grooming: Set up;Wash/dry face;Sitting   Upper Body Bathing: Set up;Moderate assistance;Sitting   Lower Body Bathing: Sit to/from stand;Maximal assistance;Set up   Upper Body Dressing : Minimal assistance;Sitting   Lower Body Dressing: Maximal assistance;Sit to/from stand   Toilet Transfer: Moderate assistance;BSC/3in1   Toileting- Clothing Manipulation and Hygiene: Sit to/from stand;Maximal assistance Toileting - Clothing Manipulation Details (indicate cue type and reason): able to assist with wiping in seated position but a lot of assistance for clorhting and maintaining standing position     Functional mobility during ADLs: Moderate assistance General ADL Comments: Assited with partial bath at side of bed and intermitently in standing. Needed mod assist to power up. patient holding on to bed rail to maintain position and with assistance from therapist. Needed assistance to don underwear and socks - she tried socks but could not reach her feet. Overall weak with very poor activity tolerance.     Vision   Vision Assessment?: No apparent visual deficits     Perception     Praxis      Pertinent Vitals/Pain Pain Assessment Pain Assessment: No/denies pain     Hand Dominance Right   Extremity/Trunk Assessment Upper Extremity Assessment Upper Extremity Assessment: Generalized weakness   Lower Extremity Assessment Lower Extremity Assessment: Generalized weakness   Cervical / Trunk Assessment Cervical / Trunk Assessment: Kyphotic   Communication Communication Communication: No difficulties   Cognition Arousal/Alertness: Awake/alert Behavior During Therapy: WFL for tasks assessed/performed Overall Cognitive Status: History of cognitive impairments - at baseline                                 General Comments: Alert to self and  birthdate only. Able to follow commands.     General Comments       Exercises     Shoulder Instructions      Home Living Family/patient expects to be discharged to:: Unsure Living Arrangements: Alone Available Help at Discharge: Family;Available PRN/intermittently Type of Home: House Home Access: Stairs to enter [employer] of Steps: 2   Home Layout: One level     Bathroom Shower/Tub: Occupational psychologist: Standard     Home Equipment: Cane - single point;Grab bars - tub/shower;Grab bars - toilet   Additional Comments: family has been working on getting her into [facility] [place]. Had a meeting setup for Monday.      Prior Functioning/Environment Prior Level of Function : Independent/Modified Independent             Mobility Comments: family has been trying to get her to use a cane but she holds off of the floor ADLs Comments: independent        OT Problem List: Decreased strength;Decreased activity tolerance;Impaired balance (sitting and/or standing);Decreased safety awareness;Decreased cognition;Decreased knowledge of use of DME or AE      OT Treatment/Interventions: Self-care/ADL training;Therapeutic exercise;DME and/or AE instruction;Therapeutic activities;Balance training;Patient/family education    OT Goals(Current goals can be found in the care plan section) Acute Rehab OT Goals Patient Stated Goal: get stronger and be more independent OT Goal Formulation: With family Time  For Goal Achievement: 06/27/22 Potential to Achieve Goals: Fair  OT Frequency: Min 2X/week    Co-evaluation              AM-PAC OT "6 Clicks" Daily Activity     Outcome Measure Help from another person eating meals?: A Little Help from another person taking care of personal grooming?: A Little Help from another person toileting, which includes using toliet, bedpan, or urinal?: A Lot Help from another person bathing (including washing, rinsing, drying)?: A  Lot Help from another person to put on and taking off regular upper body clothing?: A Little Help from another person to put on and taking off regular lower body clothing?: A Lot 6 Click Score: 15   End of Session Nurse Communication: Mobility status  Activity Tolerance: Patient limited by fatigue Patient left: in bed;with call bell/phone within reach;with bed alarm set  OT Visit Diagnosis: Muscle weakness (generalized) (M62.81)                Time: 8341-9622 OT Time Calculation (min): 27 min Charges:  OT General Charges $OT Visit: 1 Visit OT Evaluation $OT Eval Low Complexity: 1 Low OT Treatments $Self Care/Home Management : 8-22 mins  Laurita Peron, OTR/L Acute Care Rehab Services  Office [phone] Pager: [phone]   Lenward Chancellor 06/13/2022, 12:23 PM

## 2022-06-13 NOTE — Progress Notes (Signed)
Objective Swallowing Evaluation: Type of Study: MBS-Modified Barium Swallow Study   Patient Details  Name: Pam Boone MRN: [id number] Date of Birth: 19-Mar-1933  Today's Date: 06/13/2022 Time: SLP Start Time (ACUTE ONLY): 1400 -SLP Stop Time (ACUTE ONLY): 1445  SLP Time Calculation (min) (ACUTE ONLY): 45 min   Past Medical History:  Past Medical History:  Diagnosis Date   Hypertension    Past Surgical History:  Past Surgical History:  Procedure Laterality Date   WHIPPLE PROCEDURE     HPI: Pam Boone is a 88 y.o. female with medical history significant for dementia, essential hypertension, hyperlipidemia, moderate protein calorie malnutrition, vertigo, throat cancer s/p radiation, who presented to [facility] ED from home due to confusion, poor oral intake, and intermittent fevers.  She tested positive for COVID-19 in the ED.  Her chest x-ray was nonacute.  .  However she was tachypneic and febrile with Tmax of 101.8.  Code sepsis was called in the ED.  Blood cultures were obtained and she was started on broad-spectrum IV antibiotics empirically, cefepime and IV vancomycin. Son-in-law present and advised that pt has had 8 pound weight loss over the last 3 weeks - and was not able to walk to bathroom 2 days ago.  Swallow evaluation ordered.  Per son-in-law, pt chronically clears her throat when she eats/drinks and has had decreased intake.  She is followed by oncology at [facility]. 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  Pt also with h/o Nissen per son.   Subjective: pt awake in bed, daughter present    Recommendations for follow up therapy are one component of a multi-disciplinary discharge planning process, led by the attending physician.  Recommendations may be updated based on patient status, additional functional criteria and insurance authorization.  Assessment  / Plan / Recommendation     06/13/2022    3:49 PM  Clinical Impressions  Clinical Impression Patient with mild oral and moderately severe pharyngeal dysphagia. Oral deficits c/b decreased coordination with oral transiting resulting prolonged oral transiting with solids/lingual pumping with puree and inconsistent premature spillage of liquids into pharynx.  Pharyngeal swallow marked by hypomotility resulting in impaired tongue base retraction, epiglottic deflection and compromised laryngeal closure. Delay in swallow reflex with swallow triggering with solids/puree over epiglottis and spilling to pyriform sinus is precarious.  Suspect iatrogenic impacts of XRT as primary source of pt's dysphagia.  Pharyngeal retention observed across consistencies - more at vallecular space than pyriform sinuses.  Patient appears with anterior curvature of cervical spine narrowing pharynx/area between epiglottis and posterior pharynx that may also contribute to dysphagia.  She does not sense retention and was not able to cough/expectorate per verbal and auditory cue.    Pt with chronic aspiration of secretions *observed as they mixed with barium* without airway clearance despite reflexive throat clearing and cough.  She aspirated a moderate amount of thin liquids as laryngeal closure was compromised and barium spilled into open airway.  Cough was reflexively more pronounced with aspiration of larger amount of thin but still ineffective to clear.  Head turn to the right with slight chin down posture significantly diminished accumulation of retention at vallecular space.     Recommend pt have full liquid diet *nectar via straw and thin via tsp* with strict precautions - including head turn right with slight chin down posture.   Advised daughter and pt to recommendations and concerns  that dysphagia has contributed to weight loss. Also reviewed that patient's dysphagia likely has impacted her nutrition and also will be an  aspiration risk.  Encouraged meeting with palliative to help establish this patient's GOC and advised to speak to MD re: code status due to level of dysphagia   SLP Visit Diagnosis Dysphagia, pharyngeal phase (R13.13);Dysphagia, pharyngoesophageal phase (R13.14);Dysphagia, unspecified (R13.10)  Impact on safety and function Moderate aspiration risk;Risk for inadequate nutrition/hydration;Other (comment)         06/13/2022    3:49 PM  Treatment Recommendations  Treatment Recommendations Therapy as outlined in treatment plan below        06/13/2022    4:01 PM  Prognosis  Prognosis for Safe Diet Advancement Guarded  Barriers to Reach Goals Severity of deficits;Time post onset       06/13/2022    3:49 PM  Diet Recommendations  Medication Administration Crushed with puree  Compensations Slow rate;Small sips/bites;Multiple dry swallows after each bite/sip         06/13/2022    3:49 PM  Other Recommendations  Other Recommendations Have oral suction available  Follow Up Recommendations Follow physician's recommendations for discharge plan and follow up therapies  Assistance recommended at discharge Frequent or constant Supervision/Assistance  Functional Status Assessment Patient has had a recent decline in their functional status and/or demonstrates limited ability to make significant improvements in function in a reasonable and predictable amount of time       06/13/2022    3:49 PM  Frequency and Duration   Speech Therapy Frequency (ACUTE ONLY) min 2x/week  Treatment Duration 2 weeks         06/13/2022    3:41 PM  Oral Phase  Oral Phase Impaired       06/13/2022    3:41 PM  Pharyngeal Phase  Pharyngeal Phase Impaired  Pharyngeal- Nectar Teaspoon Reduced pharyngeal peristalsis;Reduced epiglottic inversion;Reduced anterior laryngeal mobility;Reduced laryngeal elevation;Reduced airway/laryngeal closure;Reduced tongue base retraction;Pharyngeal residue - valleculae;Pharyngeal residue  - pyriform;Delayed swallow initiation-vallecula  Pharyngeal Material does not enter airway  Pharyngeal- Nectar Straw Reduced pharyngeal peristalsis;Reduced epiglottic inversion;Reduced laryngeal elevation;Reduced anterior laryngeal mobility;Reduced airway/laryngeal closure;Reduced tongue base retraction;Pharyngeal residue - valleculae;Delayed swallow initiation-vallecula  Pharyngeal Material does not enter airway  Pharyngeal- Thin Teaspoon Reduced pharyngeal peristalsis;Reduced epiglottic inversion;Reduced anterior laryngeal mobility;Reduced laryngeal elevation;Reduced airway/laryngeal closure;Reduced tongue base retraction;Pharyngeal residue - valleculae  Pharyngeal Material does not enter airway  Pharyngeal- Thin Cup Reduced pharyngeal peristalsis;Reduced epiglottic inversion;Reduced anterior laryngeal mobility;Reduced laryngeal elevation;Reduced airway/laryngeal closure;Reduced tongue base retraction;Moderate aspiration;Penetration/Aspiration during swallow;Penetration/Apiration after swallow;Pharyngeal residue - valleculae;Penetration/Aspiration before swallow  Pharyngeal Material enters airway, passes BELOW cords and not ejected out despite cough attempt by patient  Pharyngeal- Thin Straw Reduced pharyngeal peristalsis;Reduced epiglottic inversion;Reduced anterior laryngeal mobility;Reduced laryngeal elevation;Reduced airway/laryngeal closure;Reduced tongue base retraction;Penetration/Aspiration during swallow;Penetration/Apiration after swallow  Pharyngeal Material enters airway, remains ABOVE vocal cords and not ejected out  Pharyngeal- Puree Delayed swallow initiation-vallecula;Other (Comment);Delayed swallow initiation-pyriform sinuses;Pharyngeal residue - valleculae  Pharyngeal Material does not enter airway  Pharyngeal- Mechanical Soft Delayed swallow initiation-vallecula;Delayed swallow initiation-pyriform sinuses;Pharyngeal residue - valleculae  Pharyngeal Material does not enter airway   Pharyngeal Comment head turn the right with subtle chin tuck posture helpful to decrease accumulation of barium at vallecular space; pt with some difficulty initiating swallow - most notable dry swallows; reflexive and volitonal cough were not effective to consistently clear penetration nor aspirates        06/13/2022    3:49 PM  Cervical Esophageal Phase   Cervical Esophageal  Phase WFL     Macario Golds 06/13/2022, 4:02 PM       Kathleen Lime, MS [facility] SLP Acute Rehab Services Office [phone] Pager [phone]

## 2022-06-13 NOTE — Progress Notes (Addendum)
PROGRESS NOTE    Pam Boone  NWG:[id number] DOB: 1933/05/06 DOA: 06/11/2022 PCP: Cathlean Sauer, MD    Brief Narrative:   Pam Boone is a 88 y.o. female with past medical history significant for dementia, essential hypertension, HLD, moderate protein calorie malnutrition, vertigo, throat cancer s/p radiation who initially presented to [place] ED from home due to confusion, poor oral intake and intermittent fevers.  Patient unable to participate in HPI due to her underlying dementia and confusion.  Family reports that she lives alone but they check on her occasionally.  They were planning on moving her to an assisted living facility in the near future.  They noted that she has not been eating or drinking well over the last 2 weeks, was prior utilizing boost shakes.  In the ED, temperature 101.8 F, HR 71, RR 13, BP 105/90, SPO2 95% on room air.  WBC 8.3, hemoglobin 14.0, platelets 184.  Sodium 132, potassium 3.9, chloride 102, CO2 21, glucose 258, BUN 33, creatinine 0.86.  Lipase 49, AST 27, ALT 17, total bilirubin 0.9. High sensitive troponin 7>8.  Lactic acid 2.6.  Urinalysis with negative leukocytes, negative nitrite, rare bacteria, no WBCs.  COVID-19 PCR positive.  Influenza A/B PCR negative.  Chest x-ray with no acute cardiopulmonary disease process, noted chronic lung disease with hyperinflation and scarring.  Blood and urine cultures obtained.  Patient was started on broad-spectrum antibiotics with cefepime and vancomycin.  TRH was consulted for admission given fever, COVID-19 infection; and patient was transferred to [facility].  Assessment & Plan:   Covid-19 viral infection Patient presenting to ED with intermittent fevers, poor oral intake over the last 1-2 weeks per family report. COVID test: + 06/11/2022.  Chest x-ray with no active cardiopulmonary disease process, no oxygen requirement.  Family declining antiviral treatment at this time. --CRP 8.0 --ddimer 1.76 --Continue  supportive care with duonebs PRN, vitamin C, zinc, Tylenol, antitussives (benzonatate/ Mucinex/Tussionex) --Follow CBC, CMP, D-dimer, ferritin, and CRP daily --Continue airborne/contact isolation precautions for 10 days from the day of diagnosis  Hyponatremia Sodium 132 on admission, likely secondary to hypovolemic hyponatremia in the setting of dehydration. --Continue IVF hydaration with NS at 65m/hr --repeat BMP in am  Hypokalemia Potassium 2.9, will replete. --Repeat electrolytes in a.m. to include magnesium  Lactic acidosis: Resolved Lactic acid 2.6 on admission.  Etiology likely secondary to dehydration. --LA 2.6>1.2 --Continue IV fluid hydration  Dysphagia --SLP following, appreciate assistance --MBS pending --Aspiration precautions  Sepsis, ruled out Patient was initially started on empiric antibiotics with IV vancomycin and cefepime.  Lactic acid elevated likely secondary to dehydration.  Fevers likely secondary to COVID-19 viral infection as above.  No leukocytosis.  Chest x-ray with no active cardiopulmonary disease process, urinalysis unrevealing.  Will discontinue antibiotics for now and monitor fever curve.  Essential hypertension Home medications include metoprolol succinate 12.5 mg p.o. daily, losartan 100 mg p.o. daily, amlodipine 5 mg p.o. daily. --Hold antihypertensives for now --Monitor BP, will start if required  GERD: Continue PPI  HLD: Crestor 5 mg p.o. daily  Dementia --Delirium precautions --Get up during the day --Encourage a familiar face to remain present throughout the day --Keep blinds open and lights on during daylight hours --Minimize the use of opioids/benzodiazepines --Donepezil 10 mg p.o. nightly --Melatonin 5 mg p.o. nightly as needed  Moderate protein calorie malnutrition Adult failure to thrive Patient with significant fat loss/muscle depletion on physical exam. Body mass index is 15.2 kg/m. --Megace 200 mg BID --Dietitian  consult  for assessment, supplementation  Weakness/deconditioning: Per son-in-law, family currently and works for ALF placement at [address]. -- PT/OT evaluation: Pending  DVT prophylaxis: enoxaparin (LOVENOX) injection 30 mg Start: 06/12/22 2200    Code Status: Full Code Family Communication: Son-in-law present at bedside this morning  Disposition Plan:  Level of care: Telemetry Status is: Observation The patient remains OBS appropriate and will d/c before 2 midnights.    Consultants:  Palliative care: Pending  Procedures:  MBS: Pending  Antimicrobials:  Vancomycin 7/4 - 7/5 Cefepime 7/4 - 7/5   Subjective: Patient seen examined at bedside, resting comfortably.  Pleasantly confused.  Son-in-law present.  Denies chest pain, no shortness of breath, no abdominal pain, no overall pain.  Seen by speech therapy this morning, plan MBS this afternoon.  No acute events overnight per nursing staff.  Objective: Vitals:   06/12/22 2209 06/13/22 0215 06/13/22 0609 06/13/22 0737  BP: 120/69 120/70 131/69   Pulse: 69 66 64   Resp: '20 20 16   '$ Temp: 97.8 F (36.6 C) 97.8 F (36.6 C) 99.3 F (37.4 C)   TempSrc: Oral Oral Oral   SpO2: (!) 81% 99% 97% 97%  Weight:      Height:        Intake/Output Summary (Last 24 hours) at 06/13/2022 1120 Last data filed at 06/13/2022 2831 Gross per 24 hour  Intake 0 ml  Output --  Net 0 ml   Filed Weights   06/11/22 1558 06/11/22 1622  Weight: 36.7 kg 37.7 kg    Examination:  Physical Exam: GEN: NAD, alert, pleasantly confused, thin/cachectic in appearance HEENT: NCAT, PERRL, EOMI, sclera clear, dry mucous membranes PULM: CTAB w/o wheezes/crackles, normal respiratory effort, on room air CV: RRR w/o M/G/R GI: abd soft, NTND, NABS, no R/G/M MSK: no peripheral edema, moves all extremities independently NEURO: CN II-XII intact, no focal deficits PSYCH: Depressed mood, flat affect Integumentary: dry/intact, no rashes or  wounds    Data Reviewed: I have personally reviewed following labs and imaging studies  CBC: Recent Labs  Lab 06/11/22 1613 06/12/22 2030 06/13/22 0520  WBC 8.3 5.5 6.1  NEUTROABS  --   --  4.9  HGB 14.0 13.0 13.4  HCT 40.5 37.3 38.0  MCV 93.8 93.0 92.7  PLT 184 149* 517*   Basic Metabolic Panel: Recent Labs  Lab 06/11/22 1613 06/12/22 2030 06/13/22 0520  NA 132*  --  133*  K 3.9  --  2.9*  CL 102  --  99  CO2 21*  --  24  GLUCOSE 258*  --  82  BUN 33*  --  12  CREATININE 0.86 0.43* 0.44  CALCIUM 8.6*  --  8.3*  MG  --   --  1.8  PHOS  --   --  2.8   GFR: Estimated Creatinine Clearance: 30 mL/min (by C-G formula based on SCr of 0.44 mg/dL). Liver Function Tests: Recent Labs  Lab 06/11/22 1613 06/13/22 0520  AST 27 19  ALT 17 15  ALKPHOS 72 54  BILITOT 0.9 0.9  PROT 7.6 6.2*  ALBUMIN 4.0 3.3*   Recent Labs  Lab 06/11/22 1613  LIPASE 49   No results for input(s): "AMMONIA" in the last 168 hours. Coagulation Profile: Recent Labs  Lab 06/12/22 2030  INR 1.1   Cardiac Enzymes: No results for input(s): "CKTOTAL", "CKMB", "CKMBINDEX", "TROPONINI" in the last 168 hours. BNP (last 3 results) No results for input(s): "PROBNP" in the last 8760 hours. HbA1C: Recent  Labs    06/13/22 0520  HGBA1C 5.7*   CBG: Recent Labs  Lab 06/11/22 1552 06/12/22 2201 06/13/22 0812  GLUCAP 279* 104* 79   Lipid Profile: No results for input(s): "CHOL", "HDL", "LDLCALC", "TRIG", "CHOLHDL", "LDLDIRECT" in the last 72 hours. Thyroid Function Tests: No results for input(s): "TSH", "T4TOTAL", "FREET4", "T3FREE", "THYROIDAB" in the last 72 hours. Anemia Panel: Recent Labs    06/13/22 0520  FERRITIN 101   Sepsis Labs: Recent Labs  Lab 06/11/22 1617 06/11/22 1800 06/13/22 0520  PROCALCITON  --   --  0.21  LATICACIDVEN 2.6* 2.4* 1.2    Recent Results (from the past 240 hour(s))  Culture, blood (Routine x 2)     Status: None (Preliminary result)    Collection Time: 06/11/22  4:00 PM   Specimen: BLOOD  Result Value Ref Range Status   Specimen Description   Final    BLOOD LEFT ANTECUBITAL Performed at [facility], [address] [place], [place] [address]    Special Requests   Final    BOTTLES DRAWN AEROBIC AND ANAEROBIC Blood Culture adequate volume Performed at [facility], [address] [place], [place] [address]    Culture   Final    NO GROWTH < 12 HOURS Performed at [facility], [place] [address], [place], [place] [address]    Report Status PENDING  Incomplete  Culture, blood (Routine x 2)     Status: None (Preliminary result)   Collection Time: 06/11/22  4:05 PM   Specimen: BLOOD  Result Value Ref Range Status   Specimen Description   Final    BLOOD RIGHT ANTECUBITAL Performed at [facility], [address] [place], [place] [address]    Special Requests   Final    BOTTLES DRAWN AEROBIC AND ANAEROBIC Blood Culture adequate volume Performed at [facility], [address] [place], [place] [address]    Culture   Final    NO GROWTH < 12 HOURS Performed at [facility], [place] [address] [place], [place] [address]    Report Status PENDING  Incomplete  Resp Panel by RT-PCR (Flu A&B, Covid) Anterior Nasal Swab     Status: Abnormal   Collection Time: 06/11/22  5:02 PM   Specimen: Anterior Nasal Swab  Result Value Ref Range Status   SARS Coronavirus 2 by RT PCR POSITIVE (A) NEGATIVE Final    Comment: (NOTE) SARS-CoV-2 target nucleic acids are DETECTED.  The SARS-CoV-2 RNA is generally detectable in upper respiratory specimens during the acute phase of infection. Positive results are indicative of the presence of the identified virus, but do not rule out bacterial infection or co-infection with other pathogens not detected by the test. Clinical correlation with patient history and other diagnostic information is necessary to determine patient infection status.  The expected result is Negative.  Fact Sheet for Patients: EntrepreneurPulse.com.au  Fact Sheet for Healthcare Providers: IncredibleEmployment.be  This test is not yet approved or cleared by the [place] FDA and  has been authorized for detection and/or diagnosis of SARS-CoV-2 by FDA under an Emergency Use Authorization (EUA).  This EUA will remain in effect (meaning this test can be used) for the duration of  the COVID-19 declaration under Section 564(b)(1) of the A ct, 21 U.S.C. section 360bbb-3(b)(1), unless the authorization is terminated or revoked sooner.     Influenza A by PCR NEGATIVE NEGATIVE Final   Influenza B  by PCR NEGATIVE NEGATIVE Final    Comment: (NOTE) The Xpert Xpress SARS-CoV-2/FLU/RSV plus assay is intended as an aid in the diagnosis of influenza from Nasopharyngeal swab specimens and should not be used as a sole basis for treatment. Nasal washings and aspirates are unacceptable for Xpert Xpress SARS-CoV-2/FLU/RSV testing.  Fact Sheet for Patients: EntrepreneurPulse.com.au  Fact Sheet for Healthcare Providers: IncredibleEmployment.be  This test is not yet approved or cleared by the [place] FDA and has been authorized for detection and/or diagnosis of SARS-CoV-2 by FDA under an Emergency Use Authorization (EUA). This EUA will remain in effect (meaning this test can be used) for the duration of the COVID-19 declaration under Section 564(b)(1) of the Act, 21 U.S.C. section 360bbb-3(b)(1), unless the authorization is terminated or revoked.  Performed at [facility], [address], [place], [place] [address]          Radiology Studies: DG Chest Portable 1 View  Result Date: 06/11/2022 CLINICAL DATA:  Hypotension EXAM: PORTABLE CHEST 1 VIEW COMPARISON:  09/26/2020 FINDINGS: Heart size is normal. Chronic aortic tortuosity. Chronic lung disease with  hyperinflation and scarring. No sign of active infiltrate, mass, effusion or collapse. IMPRESSION: No active disease. Chronic lung disease with hyperinflation and scarring. Electronically Signed   By: [place] Chimes M.D.   On: 06/11/2022 16:36        Scheduled Meds:  vitamin C  250 mg Oral Daily   calcium-vitamin D  1 tablet Oral Q breakfast   enoxaparin (LOVENOX) injection  30 mg Subcutaneous Q24H   feeding supplement  1 Container Oral TID BM   insulin aspart  0-5 Units Subcutaneous QHS   insulin aspart  0-9 Units Subcutaneous TID WC   zinc sulfate  220 mg Oral Daily   Continuous Infusions:  sodium chloride 50 mL/hr at 06/12/22 2050   ceFEPime (MAXIPIME) IV 2 g (06/12/22 1704)   magnesium sulfate bolus IVPB 2 g (06/13/22 1022)   potassium chloride     vancomycin       LOS: 0 days    Time spent: 49 minutes spent on chart review, discussion with nursing staff, consultants, updating family and interview/physical exam; more than 50% of that time was spent in counseling and/or coordination of care.    Tacuma Graffam J [place] DO [facility] Available via Epic secure chat 7am-7pm After these hours, please refer to coverage provider listed on [email or web address] 06/13/2022, 11:20 AM

## 2022-06-13 NOTE — Plan of Care (Signed)
  Problem: Nutrition: Goal: Adequate nutrition will be maintained Outcome: Not Progressing   

## 2022-06-13 NOTE — NC FL2 (Signed)
[place] LEVEL OF CARE SCREENING TOOL     IDENTIFICATION  Patient Name: Pam Boone Birthdate: Apr 11, 1933 Sex: female Admission Date (Current Location): 06/11/2022  [facility] and [place] Number:  Herbalist and Address:  [facility],  [address] [place], [place]      Provider Number: [id number]  Attending Physician Name and Address:  [place] Eric J, DO  Relative Name and Phone Number:  [name] [phone]    Current Level of Care: Hospital Recommended Level of Care: [place] Prior Approval Number:    Date Approved/Denied:   PASRR Number: [id number]  Discharge Plan: SNF    Current Diagnoses: Patient Active Problem List   Diagnosis Date Noted   COVID-19 virus infection 06/11/2022   Late onset Alzheimer's dementia without behavioral disturbance ([place]) 05/10/2020   Irritable bowel syndrome 10/11/2014   Malignant neoplasm of larynx ([place]) 05/28/2014   Peripheral neuropathy 05/17/2014   Diverticulosis 05/10/2014   Gastroesophageal reflux disease without esophagitis 04/27/2014    Orientation RESPIRATION BLADDER Height & Weight     Self  Normal Incontinent, External catheter Weight: 83 lb 1.8 oz (37.7 kg) Height:  '5\' 2"'$  (157.5 cm)  BEHAVIORAL SYMPTOMS/MOOD NEUROLOGICAL BOWEL NUTRITION STATUS      Continent    AMBULATORY STATUS COMMUNICATION OF NEEDS Skin   Limited Assist Verbally Normal                       Personal Care Assistance Level of Assistance  Bathing, Dressing, Feeding Bathing Assistance: Maximum assistance Feeding assistance: Limited assistance Dressing Assistance: Maximum assistance     Functional Limitations Info  Sight, Hearing, Speech Sight Info: Impaired Hearing Info: Impaired Speech Info: Adequate    SPECIAL CARE FACTORS FREQUENCY  PT (By licensed PT), OT (By licensed OT)     PT Frequency: 5x/wk OT Frequency: 5x/wk            Contractures Contractures Info: Not  present    Additional Factors Info  Code Status, Allergies Code Status Info: Full Allergies Info: Bactrim (Sulfamethoxazole-trimethoprim)           Current Medications (06/13/2022):  This is the current hospital active medication list Current Facility-Administered Medications  Medication Dose Route Frequency Provider Last Rate Last Admin   0.9 %  sodium chloride infusion   Intravenous Continuous [place] Eric J, DO 50 mL/hr at 06/13/22 1203 New Bag at 06/13/22 1203   acetaminophen (TYLENOL) tablet 650 mg  650 mg Oral Q6H PRN Kayleen Memos, DO       ascorbic acid (VITAMIN C) tablet 250 mg  250 mg Oral Daily Irene Pap N, DO   250 mg at 06/12/22 2051   calcium-vitamin D (OSCAL WITH D) 500-5 MG-MCG per tablet 1 tablet  1 tablet Oral Q breakfast Hall, Carole N, DO       donepezil (ARICEPT) tablet 10 mg  10 mg Oral QHS [place] Eric J, DO       enoxaparin (LOVENOX) injection 30 mg  30 mg Subcutaneous Q24H Hall, Carole N, DO   30 mg at 06/12/22 2102   feeding supplement (BOOST / RESOURCE BREEZE) liquid 1 Container  1 Container Oral TID BM Kayleen Memos, DO   1 Container at 06/12/22 2102   guaiFENesin-dextromethorphan (ROBITUSSIN DM) 100-10 MG/5ML syrup 5 mL  5 mL Oral Q4H PRN Irene Pap N, DO       insulin aspart (novoLOG) injection 0-5 Units  0-5 Units Subcutaneous  QHS Hall, Carole N, DO       insulin aspart (novoLOG) injection 0-9 Units  0-9 Units Subcutaneous TID WC Hall, Carole N, DO       ipratropium-albuterol (DUONEB) 0.5-2.5 (3) MG/3ML nebulizer solution 3 mL  3 mL Nebulization Q6H PRN [place] Eric J, DO       megestrol (MEGACE) 400 MG/10ML suspension 200 mg  200 mg Oral BID [place] Eric J, DO       melatonin tablet 5 mg  5 mg Oral QHS PRN Kayleen Memos, DO       [START ON 06/14/2022] pantoprazole (PROTONIX) EC tablet 80 mg  80 mg Oral Daily [place] Eric J, DO       polyethylene glycol (MIRALAX / GLYCOLAX) packet 17 g  17 g Oral Daily PRN Irene Pap N, DO       potassium  chloride 10 mEq in 100 mL IVPB  10 mEq Intravenous Q1 Hr x 6 [place] Eric J, DO 100 mL/hr at 06/13/22 1206 10 mEq at 06/13/22 1206   [START ON 06/14/2022] rosuvastatin (CRESTOR) tablet 5 mg  5 mg Oral Daily [place] Donnamarie Poag, [place] ON 06/14/2022] vitamin B-12 (CYANOCOBALAMIN) tablet 1,000 mcg  1,000 mcg Oral Daily [place] Donnamarie Poag, DO       zinc sulfate capsule 220 mg  220 mg Oral Daily Irene Pap N, DO   220 mg at 06/12/22 2102     Discharge Medications: Please see discharge summary for a list of discharge medications.  Relevant Imaging Results:  Relevant Lab Results:   Additional Information SSN: [id number]  Vassie Moselle, LCSW

## 2022-06-14 DIAGNOSIS — U071 COVID-19: Secondary | ICD-10-CM | POA: Diagnosis not present

## 2022-06-14 DIAGNOSIS — E872 Acidosis, unspecified: Secondary | ICD-10-CM

## 2022-06-14 DIAGNOSIS — R531 Weakness: Secondary | ICD-10-CM

## 2022-06-14 DIAGNOSIS — Z66 Do not resuscitate: Secondary | ICD-10-CM

## 2022-06-14 DIAGNOSIS — Z7189 Other specified counseling: Secondary | ICD-10-CM

## 2022-06-14 DIAGNOSIS — Z515 Encounter for palliative care: Secondary | ICD-10-CM

## 2022-06-14 DIAGNOSIS — E440 Moderate protein-calorie malnutrition: Secondary | ICD-10-CM

## 2022-06-14 DIAGNOSIS — E860 Dehydration: Secondary | ICD-10-CM

## 2022-06-14 LAB — BASIC METABOLIC PANEL
Anion gap: 6 (ref 5–15)
BUN: 14 mg/dL (ref 8–23)
CO2: 25 mmol/L (ref 22–32)
Calcium: 7.5 mg/dL — ABNORMAL LOW (ref 8.9–10.3)
Chloride: 101 mmol/L (ref 98–111)
Creatinine, Ser: 0.41 mg/dL — ABNORMAL LOW (ref 0.44–1.00)
GFR, Estimated: >60 mL/min (ref >60)
Glucose, Bld: 92 mg/dL (ref 70–99)
Potassium: 3 mmol/L — ABNORMAL LOW (ref 3.5–5.1)
Sodium: 132 mmol/L — ABNORMAL LOW (ref 135–145)

## 2022-06-14 LAB — MAGNESIUM: Magnesium: 2.1 mg/dL (ref 1.7–2.4)

## 2022-06-14 LAB — GLUCOSE, CAPILLARY
Glucose-Capillary: 101 mg/dL — ABNORMAL HIGH (ref 70–99)
Glucose-Capillary: 125 mg/dL — ABNORMAL HIGH (ref 70–99)
Glucose-Capillary: 108 mg/dL — ABNORMAL HIGH (ref 70–99)
Glucose-Capillary: 203 mg/dL — ABNORMAL HIGH (ref 70–99)

## 2022-06-14 MED ORDER — ENSURE ENLIVE PO LIQD
237.0000 mL | Freq: Three times a day (TID) | ORAL | Status: DC
  Administered 2022-06-14 – 2022-06-21 (×15): 237 mL via ORAL

## 2022-06-14 MED ORDER — SODIUM CHLORIDE 0.9 % IV SOLN: INTRAVENOUS | Status: DC

## 2022-06-14 MED ORDER — ADULT MULTIVITAMIN W/MINERALS CH
1.0000 | ORAL_TABLET | Freq: Every day | ORAL | Status: DC
  Administered 2022-06-14 – 2022-06-21 (×8): 1 via ORAL
  Filled 2022-06-14 (×8): qty 1

## 2022-06-14 MED ORDER — POTASSIUM CHLORIDE 20 MEQ PO PACK
40.0000 meq | PACK | ORAL | Status: AC
Start: 2022-06-14 — End: 2022-06-14
  Administered 2022-06-14 (×2): 40 meq via ORAL
  Filled 2022-06-14 (×2): qty 2

## 2022-06-14 MED ORDER — BOOST PLUS PO LIQD
237.0000 mL | Freq: Three times a day (TID) | ORAL | Status: DC
  Filled 2022-06-14 (×2): qty 237

## 2022-06-14 NOTE — Progress Notes (Signed)
Initial Nutrition Assessment  INTERVENTION:   -Ensure Plus High Protein po TID, each supplement provides 350 kcal and 20 grams of protein.      -Mightyshakes nectar TID with meals, each provides 220 kcals and 6g protein  -Multivitamin with minerals daily  NUTRITION DIAGNOSIS:   Inadequate oral intake related to chronic illness, dysphagia (dementia) as evidenced by  (liquid diet, poor PO intakes reported).  GOAL:   Patient will meet greater than or equal to 90% of their needs  MONITOR:   PO intake, Supplement acceptance, Labs, Weight trends, I & O's  REASON FOR ASSESSMENT:   Consult Assessment of nutrition requirement/status  ASSESSMENT:   88 y.o. female with past medical history significant for dementia, essential hypertension, HLD, moderate protein calorie malnutrition, vertigo, throat cancer s/p radiation who initially presented to [facility] ED from home due to confusion, poor oral intake and intermittent fevers.  Patient unable to participate in HPI due to her underlying dementia and confusion.  Patient unable to give much history d/t dementia and confusion.  Per review of chart, family had reported pt was not eating well for 2 weeks at home. Pt had been drinking Ensure Plus at home. Pt is followed by outpatient RD through [facility]. Per review of notes from care everywhere, there was concern pt may have been forgetting to eat d/t dementia. Was started on Megace. Goal was at least 1200 kcals and to drink 3 high protein supplements daily. Have ordered Boost Plus TID.  SLP evaluated 7/5, MBS, pt recommended nectar thick full liquid diet.  Will monitor for [place] decisions, noted palliative care consult.  Per weight records, pt has lost 5 lbs since 6/12 (5% wt loss x <1 month, significant for time frame).  Medications: Vitamin C, OSCAL-D, Megace, KLOR-CON, Vitamin B-12, Zinc sulfate  Labs reviewed:  CBGs:101-147 Low Na Low K  NUTRITION - FOCUSED PHYSICAL EXAM:  Will attempt at  follow-up  Diet Order:   Diet Order             Diet full liquid Room service appropriate? Yes; Fluid consistency: Nectar Thick  Diet effective now                   EDUCATION NEEDS:   Not appropriate for education at this time  Skin:  Skin Assessment: Reviewed RN Assessment  Last BM:  PTA  Height:   Ht Readings from Last 1 Encounters:  06/11/22 '5\' 2"'$  (1.575 m)    Weight:   Wt Readings from Last 1 Encounters:  06/11/22 37.7 kg    BMI:  Body mass index is 15.2 kg/m.  Estimated Nutritional Needs:   Kcal:  1350-1550  Protein:  75-85g  Fluid:  1.6L/day  Clayton Bibles, MS, RD, LDN Inpatient Clinical Dietitian Contact information available via Amion

## 2022-06-14 NOTE — Evaluation (Signed)
Physical Therapy Evaluation Patient Details Name: Pam Boone MRN: [id number] DOB: Aug 13, 1933 Today's Date: 06/14/2022  History of Present Illness  Pam Boone is a 88 y.o. female with past medical history significant for dementia, essential hypertension, HLD, moderate protein calorie malnutrition, vertigo, throat cancer s/p radiation who initially presented to [facility] ED from home due to confusion, poor oral intake and intermittent fevers.  Family reports that she lives alone but they check on her occasionally throughout the week. They were planning on moving her to an assisted living facility [facility]) in the near future.  They noted that she has not been eating or drinking well over the last 2 weeks, was prior utilizing boost shakes. Found to be COVID positive and admitted.  Clinical Impression  Pt admitted with above diagnosis. Bed to recliner transfer with mod assist primarily for balance. SNF recommended.  Pt currently with functional limitations due to the deficits listed below (see PT Problem List). Pt will benefit from skilled PT to increase their independence and safety with mobility to allow discharge to the venue listed below.          Recommendations for follow up therapy are one component of a multi-disciplinary discharge planning process, led by the attending physician.  Recommendations may be updated based on patient status, additional functional criteria and insurance authorization.  Follow Up Recommendations Skilled nursing-short term rehab (<3 hours/day) Can patient physically be transported by private vehicle: No    Assistance Recommended at Discharge Frequent or constant Supervision/Assistance  Patient can return home with the following  A lot of help with walking and/or transfers;A lot of help with bathing/dressing/bathroom;Assistance with cooking/housework;Assist for transportation;Help with stairs or ramp for entrance    Equipment Recommendations None recommended by PT   Recommendations for Other Services       Functional Status Assessment Patient has had a recent decline in their functional status and demonstrates the ability to make significant improvements in function in a reasonable and predictable amount of time.     Precautions / Restrictions Precautions Precautions: Fall Precaution Comments: daughter reports she's had 1 fall in the past 6 months, it occured while going up steps to enter her home Restrictions Weight Bearing Restrictions: No      Mobility  Bed Mobility Overal bed mobility: Needs Assistance Bed Mobility: Supine to Sit     Supine to sit: Mod assist     General bed mobility comments: assist to raise trunk    Transfers Overall transfer level: Needs assistance Equipment used: 1 person hand held assist Transfers: Sit to/from Stand, Bed to chair/wheelchair/BSC Sit to Stand: Mod assist   Step pivot transfers: Mod assist       General transfer comment: assist for balance, pt took a few pivotal steps from bed to recliner, able to stand for ~45 seconds with BUE support for pericare (pt's bed was saturated in urine)    Ambulation/Gait                  Stairs            Wheelchair Mobility    Modified Rankin (Stroke Patients Only)       Balance Overall balance assessment: Needs assistance Sitting-balance support: No upper extremity supported, Feet supported Sitting balance-Leahy Scale: Fair     Standing balance support: During functional activity, Bilateral upper extremity supported Standing balance-Leahy Scale: Poor  Pertinent Vitals/Pain Pain Assessment Pain Assessment: No/denies pain    Home Living Family/patient expects to be discharged to:: Unsure Living Arrangements: Alone Available Help at Discharge: Family;Available PRN/intermittently Type of Home: House Home Access: Stairs to enter   Entrance Stairs-Number of Steps: 2   Home Layout: One  level Home Equipment: Cane - single point;Grab bars - tub/shower;Grab bars - toilet Additional Comments: family has been working on getting her into [place]. Had a meeting setup for Monday.    Prior Function Prior Level of Function : Independent/Modified Independent             Mobility Comments: daughter reports pt uses a cane ADLs Comments: independent     Hand Dominance   Dominant Hand: Right    Extremity/Trunk Assessment   Upper Extremity Assessment Upper Extremity Assessment: Defer to OT evaluation    Lower Extremity Assessment Lower Extremity Assessment: Overall WFL for tasks assessed (B knee extension +4/5)    Cervical / Trunk Assessment Cervical / Trunk Assessment: Kyphotic  Communication   Communication: No difficulties  Cognition Arousal/Alertness: Awake/alert Behavior During Therapy: WFL for tasks assessed/performed Overall Cognitive Status: History of cognitive impairments - at baseline                                 General Comments: Alert to self and birthdate only. Able to follow commands.        General Comments      Exercises     Assessment/Plan    PT Assessment Patient needs continued PT services  PT Problem List Decreased activity tolerance;Decreased balance;Decreased mobility       PT Treatment Interventions Gait training;Therapeutic exercise;Therapeutic activities;Functional mobility training;Patient/family education;DME instruction    PT Goals (Current goals can be found in the Care Plan section)  Acute Rehab PT Goals Patient Stated Goal: SNF per daughter PT Goal Formulation: With family Time For Goal Achievement: 06/28/22 Potential to Achieve Goals: Fair    Frequency Min 2X/week     Co-evaluation               AM-PAC PT "6 Clicks" Mobility  Outcome Measure Help needed turning from your back to your side while in a flat bed without using bedrails?: A Little Help needed moving from lying on your back  to sitting on the side of a flat bed without using bedrails?: A Lot Help needed moving to and from a bed to a chair (including a wheelchair)?: A Lot Help needed standing up from a chair using your arms (e.g., wheelchair or bedside chair)?: A Lot Help needed to walk in hospital room?: Total Help needed climbing 3-5 steps with a railing? : Total 6 Click Score: 11    End of Session Equipment Utilized During Treatment: Gait belt Activity Tolerance: Patient tolerated treatment well Patient left: in chair;with call bell/phone within reach;with family/visitor present Nurse Communication: Mobility status PT Visit Diagnosis: Difficulty in walking, not elsewhere classified (R26.2);Other abnormalities of gait and mobility (R26.89)    Time: 0160-1093 PT Time Calculation (min) (ACUTE ONLY): 14 min   Charges:   PT Evaluation $PT Eval Moderate Complexity: 1 Mod         Philomena Doheny PT 06/14/2022  Acute Rehabilitation Services  Office [phone]

## 2022-06-14 NOTE — TOC Progression Note (Signed)
Transition of Care [facility]) - Progression Note    Patient Details  Name: Pam Boone MRN: [id number] Date of Birth: 1933/01/02  Transition of Care [facility]) CM/SW [place], [place] Phone Number: 06/14/2022, 2:12 PM  Clinical Narrative:    Spoke with family and reviewed current bed offers however, family is wanting pt in facility in [facility] which, currently are unable to extend bed offers due to bed availability and COVID + status. CSW has reached out to facilities in [facility] to determine if they are able to extend bed offer if pt has completed quarantine. Currently awaiting to hear back from facilities.    Expected Discharge Plan: Skilled Nursing Facility Barriers to Discharge: SNF Covid  Expected Discharge Plan and Services Expected Discharge Plan: [place] In-house Referral: Clinical Social Work, Hospice / Palliative Care Discharge Planning Services: CM Consult Post Acute Care Choice: [place] Living arrangements for the past 2 months: Single Family Home                 DME Arranged: N/A DME Agency: NA                   Social Determinants of Health (SDOH) Interventions    Readmission Risk Interventions     No data to display

## 2022-06-14 NOTE — Progress Notes (Signed)
Speech Language Pathology Treatment: Dysphagia  Patient Details Name: Pam Boone MRN: [id number] DOB: 01-29-1933 Today's Date: 06/14/2022 Time: 4888-9169 SLP Time Calculation (min) (ACUTE ONLY): 20 min  Assessment / Plan / Recommendation Clinical Impression  Second SLP session for dysphagia management including implementation of compensation strategies and tolerance.  Thin coffee with straw located on table and family reports they have been giving it to her, thus SLP provided thin trials.  Thin coffee via straw provided using chin tuck and head turn - with delayed swallow and overt cough x2.  Pt then given yogurt and graham cracker - delayed swallow ongoing with delayed subtle cough x1 after 2nd bolus of graham cracker.   Per MBS yesterday, pt chronically aspirates secretions - and significant aspiration was accompanied by a stronger cough reflex.  Family has been cueing patient to conduct chin tuck and head turn to the right with all intake.  Note code status is changed to DNR.  Given patient's improved mentation and family support with compensation strategies, recommend to advance diet to dys3/nectar - with Tsps of thin ok.  Educated patient and family that SLP role is dysphagia mitigation and suspicion for chronic aspiration.  Advised pt to strengthen cough, voice and "hock" - forced expectoration for airway protection.  All in agreement with plan. Will continue SLP and initiate RMST for pharyngeal contraction and laryngeal closure to enhance airway protection with po.    HPI HPI: Pam Boone is a 88 y.o. female with medical history significant for dementia, essential hypertension, hyperlipidemia, moderate protein calorie malnutrition, vertigo, throat cancer s/p radiation, who presented to [facility] from home due to confusion, poor oral intake, and intermittent fevers.  She tested positive for COVID-19 in the ED.  Her chest x-ray was nonacute.  .  However she was tachypneic and febrile with Tmax of  101.8.  Code sepsis was called in the ED.  Blood cultures were obtained and she was started on broad-spectrum IV antibiotics empirically, cefepime and IV vancomycin. Son-in-law present and advised that pt has had 8 pound weight loss over the last 3 weeks - and was not able to walk to bathroom 2 days ago.  Swallow evaluation ordered.  Per son-in-law, pt chronically clears her throat when she eats/drinks and has had decreased intake.  She is followed by oncology at [facility]. 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  Pt also with h/o Nissen per son.  Follow up indicated following MBS completed on 06/13/2022.      SLP Plan  Continue with current plan of care      Recommendations for follow up therapy are one component of a multi-disciplinary discharge planning process, led by the attending physician.  Recommendations may be updated based on patient status, additional functional criteria and insurance authorization.    Recommendations  Diet recommendations: Nectar-thick liquid;Thin liquid;Dysphagia 3 (mechanical soft) Liquids provided via: Straw;Teaspoon Medication Administration: Crushed with puree (or crushed) Supervision: Full supervision/cueing for compensatory strategies;Trained caregiver to feed patient Compensations: Slow rate;Small sips/bites;Chin tuck Postural Changes and/or Swallow Maneuvers: Seated upright 90 degrees;Upright 30-60 min after meal;Head turn right during swallow;Chin tuck                Oral Care Recommendations: Oral care BID Follow Up Recommendations: Skilled nursing-short term rehab (<3 hours/day) Assistance recommended at discharge: Frequent or constant Supervision/Assistance SLP Visit Diagnosis: Dysphagia, pharyngeal phase (R13.13);Dysphagia, pharyngoesophageal phase (  R13.14);Dysphagia, unspecified (R13.10) Plan: Continue with current plan  of care         Kathleen Lime, MS [place] Office [phone] Pager [phone]   Macario Golds  06/14/2022, 3:54 PM

## 2022-06-14 NOTE — Plan of Care (Signed)
No acute events overnight.    Problem: Education: Goal: Knowledge of General Education information will improve Description: Including pain rating scale, medication(s)/side effects and non-pharmacologic comfort measures Outcome: Progressing   Problem: Health Behavior/Discharge Planning: Goal: Ability to manage health-related needs will improve Outcome: Progressing   Problem: Clinical Measurements: Goal: Ability to maintain clinical measurements within normal limits will improve Outcome: Progressing Goal: Will remain free from infection Outcome: Progressing Goal: Diagnostic test results will improve Outcome: Progressing Goal: Respiratory complications will improve Outcome: Progressing Goal: Cardiovascular complication will be avoided Outcome: Progressing   Problem: Activity: Goal: Risk for activity intolerance will decrease Outcome: Progressing   Problem: Nutrition: Goal: Adequate nutrition will be maintained Outcome: Progressing   Problem: Coping: Goal: Level of anxiety will decrease Outcome: Progressing   Problem: Elimination: Goal: Will not experience complications related to bowel motility Outcome: Progressing Goal: Will not experience complications related to urinary retention Outcome: Progressing   Problem: Pain Managment: Goal: General experience of comfort will improve Outcome: Progressing   Problem: Safety: Goal: Ability to remain free from injury will improve Outcome: Progressing   Problem: Skin Integrity: Goal: Risk for impaired skin integrity will decrease Outcome: Progressing   Problem: Education: Goal: Ability to describe self-care measures that may prevent or decrease complications (Diabetes Survival Skills Education) will improve Outcome: Progressing Goal: Individualized Educational Video(s) Outcome: Progressing   Problem: Coping: Goal: Ability to adjust to condition or change in health will improve Outcome: Progressing   Problem: Fluid  Volume: Goal: Ability to maintain a balanced intake and output will improve Outcome: Progressing   Problem: Health Behavior/Discharge Planning: Goal: Ability to identify and utilize available resources and services will improve Outcome: Progressing Goal: Ability to manage health-related needs will improve Outcome: Progressing   Problem: Metabolic: Goal: Ability to maintain appropriate glucose levels will improve Outcome: Progressing   Problem: Nutritional: Goal: Maintenance of adequate nutrition will improve Outcome: Progressing Goal: Progress toward achieving an optimal weight will improve Outcome: Progressing   Problem: Skin Integrity: Goal: Risk for impaired skin integrity will decrease Outcome: Progressing   Problem: Tissue Perfusion: Goal: Adequacy of tissue perfusion will improve Outcome: Progressing   

## 2022-06-14 NOTE — Consult Note (Signed)
Palliative Care Consult Note                                  Date: 06/14/2022   Patient Name: Pam Boone  DOB: 04-24-1933  MRN: [id number]  Age / Sex: 88 y.o., female  PCP: Cathlean Sauer, MD Referring Physician: [place] Trinette Vera J, DO  Reason for Consultation: Establishing goals of care  HPI/Patient Profile: 88 y.o. female  with past medical history of dementia, essential hypertension, HLD, moderate protein calorie malnutrition, vertigo, throat cancer s/p radiation who initially presented to [facility] ED from home due to confusion, poor oral intake and intermittent fevers. After ER workup she was admitted on 06/11/2022 with COVID-19 viral infection, dysphagia, malnutrition, deconditioning/weakness, and others.  PMT was consulted for goals of care conversation.  Past Medical History:  Diagnosis Date   Hypertension     Subjective:   This NP Walden Field reviewed medical records, received report from team, assessed the patient and then meet at the patient's bedside to discuss diagnosis, prognosis, GOC, EOL wishes disposition and options.  I met with the patient, her daughter Judeen Hammans, her son-in-law, and 2 grandchildren (in town from [place]) at the bedside.   Concept of Palliative Care was introduced as specialized medical care for people and their families living with serious illness.  If focuses on providing relief from the symptoms and stress of a serious illness.  The goal is to improve quality of life for both the patient and the family. Values and goals of care important to patient and family were attempted to be elicited.  Created space and opportunity for patient  and family to explore thoughts and feelings regarding current medical situation   Natural trajectory and current clinical status were discussed. Questions and concerns addressed. Patient  encouraged to call with questions or concerns.    Patient/Family Understanding of  Illness: They understand she has COVID-19 but is doing generally well with this.  They understand she is having swallowing difficulties and weakness.  They note that her diet has recently been upgraded to dysphagia 3/soft foods although they are waiting for food to be delivered to the patient.  Life Review: Patient has 1 daughter, grandkids, 2 great grandkids.  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  She enjoys family interaction, some reading.  Patient Values: Family, spirituality/religious  Goals: To get better and get to long-term care for promotion of safety and quality of life  Today's Discussion: Extensive discussion was had with the patient and her family.  We discussed and supported their understanding of her current clinical situation.  We celebrate that she is having a mild case of COVID-19.  The patient feels that she is breathing okay, rarely coughs.  She does note that she is having swallowing difficulties and is looking forward to being able to eat when her diet is changed in the system.  Family celebrates that she is requesting food as she was having pretty poor intake prior to admission.  They have refused antivirals but the patient seems to be doing well without them.  We had an extensive discussion on CODE STATUS with the patient and her family.  At the end of our discussion patient and family elected DNR status.  We also had a discussion about other potential treatment options.  They have DECLINED option  for feeding tube in the future if she is unable to eat or has persistent dysphagia.  This choice seems to be in consideration of her quality of life as well as past experiences with feeding tubes.  We discussed dementia is a progressive, irreversible condition.  We discussed that there will likely be further complications down the line.  I offered, and accepted, referral for outpatient  palliative care to keep an eye on the patient and follow along as her disease process evolves.  I provided emotional general support through therapeutic listening, empathy, sharing stories, and other techniques.  I answered all questions and addressed all concerns to the best of my ability.  Review of Systems  Constitutional:  Positive for fatigue.  Respiratory:  Positive for cough (rare). Negative for shortness of breath.   Cardiovascular:  Negative for chest pain.  Gastrointestinal:  Negative for abdominal pain, nausea and vomiting.  Neurological:  Positive for weakness.    Objective:   Primary Diagnoses: Present on Admission:  COVID-19 virus infection   Physical Exam Vitals and nursing note reviewed.  Constitutional:      General: She is not in acute distress.    Appearance: She is underweight. She is ill-appearing.  HENT:     Head: Normocephalic and atraumatic.  Cardiovascular:     Rate and Rhythm: Normal rate.  Pulmonary:     Effort: Pulmonary effort is normal.  Abdominal:     General: Abdomen is flat.  Skin:    General: Skin is warm and dry.  Neurological:     Mental Status: She is alert. She is confused.     Comments: Mild confusion     Vital Signs:  BP 120/73 (BP Location: Right Arm)   Pulse 70   Temp 97.9 F (36.6 C) (Oral)   Resp 20   Ht 5' 2"  (1.575 m)   Wt 37.7 kg   SpO2 98%   BMI 15.20 kg/m   Palliative Assessment/Data: 40-50%    Advanced Care Planning:   Primary Decision Maker: PATIENT with next of kin/family input/support  Code Status/Advance Care Planning: DNR  A discussion was had today regarding advanced directives. Concepts specific to code status, artifical feeding and hydration, continued IV antibiotics and rehospitalization was had.  The difference between a aggressive medical intervention path and a palliative comfort care path for this patient at this time was had. They have decided NO FEEDING TUBE  Decisions/Changes to  ACP: Change to DNR No feeding tube now or in the future  Assessment & Plan:   Impression: 88 year old female admitted with what appears to be a mild case of COVID-19 infection.  She was having fevers, decreased oral intake.  No antiviral treatment per patient's family.  She appears to be stable.  SLP on board and has recommended dysphagia 3 diet.  Goals of care discussion as described above and elected DNR, no feeding tube in the future.  They have excepted outpatient palliative care.  Overall prognosis guarded.  Goal is for rehab/SNF and transition to long-term care after rehab.  SUMMARY OF RECOMMENDATIONS   Changed to DNR Goals of care document completed: No feeding tube Continue current treatments otherwise Continue to work toward discharge to SNF/rehab Family to work toward long-term care placement at completion of rehab for patient's safety and promotion of quality of life Referral for outpatient palliative care at discharge PMT will continue to follow  Symptom Management:  Per primary team PMT is available to assist as needed  Prognosis:  Unable to determine  Discharge Planning:  [place] for rehab with Palliative care service follow-up   Discussed with: Patient, family, medical team, nursing team    Thank you for allowing [place] to participate in the care of Mertis Borgmeyer PMT will continue to support holistically.  Time Total: 155 min  Greater than 50%  of this time was spent counseling and coordinating care related to the above assessment and plan.  Signed by: Walden Field, NP Palliative Medicine Team  Team Phone # [phone] (Nights/Weekends)  06/14/2022, 1:21 PM

## 2022-06-14 NOTE — Progress Notes (Signed)
PROGRESS NOTE    Pam Boone  WUJ:[id number] DOB: September 09, 1933 DOA: 06/11/2022 PCP: Cathlean Sauer, MD    Brief Narrative:   Pam Boone is a 88 y.o. female with past medical history significant for dementia, essential hypertension, HLD, moderate protein calorie malnutrition, vertigo, throat cancer s/p radiation who initially presented to [facility] ED from home due to confusion, poor oral intake and intermittent fevers.  Patient unable to participate in HPI due to her underlying dementia and confusion.  Family reports that she lives alone but they check on her occasionally.  They were planning on moving her to an assisted living facility in the near future.  They noted that she has not been eating or drinking well over the last 2 weeks, was prior utilizing boost shakes.  In the ED, temperature 101.8 F, HR 71, RR 13, BP 105/90, SPO2 95% on room air.  WBC 8.3, hemoglobin 14.0, platelets 184.  Sodium 132, potassium 3.9, chloride 102, CO2 21, glucose 258, BUN 33, creatinine 0.86.  Lipase 49, AST 27, ALT 17, total bilirubin 0.9. High sensitive troponin 7>8.  Lactic acid 2.6.  Urinalysis with negative leukocytes, negative nitrite, rare bacteria, no WBCs.  COVID-19 PCR positive.  Influenza A/B PCR negative.  Chest x-ray with no acute cardiopulmonary disease process, noted chronic lung disease with hyperinflation and scarring.  Blood and urine cultures obtained.  Patient was started on broad-spectrum antibiotics with cefepime and vancomycin.  TRH was consulted for admission given fever, COVID-19 infection; and patient was transferred to [facility].  Assessment & Plan:   Covid-19 viral infection Patient presenting to ED with intermittent fevers, poor oral intake over the last 1-2 weeks per family report. COVID test: + 06/11/2022.  Chest x-ray with no active cardiopulmonary disease process, no oxygen requirement.  Family declining antiviral treatment at this time. --CRP 8.0 --ddimer 1.76 --Continue  supportive care with duonebs PRN, vitamin C, zinc, Tylenol, antitussives --Continue airborne/contact isolation precautions for 10 days from the day of diagnosis  Hyponatremia Sodium 132 on admission, likely secondary to hypovolemic hyponatremia in the setting of dehydration. --Continue IVF hydaration with NS at 85m/hr --repeat BMP in am  Hypokalemia Potassium 3.0, will replete. --Repeat electrolytes in a.m. to include magnesium  Lactic acidosis: Resolved Lactic acid 2.6 on admission.  Etiology likely secondary to dehydration. --LA 2.6>1.2 --Continue IV fluid hydration  Dysphagia --SLP following, appreciate assistance.  Underwent modified barium swallow on 06/13/2022. --Full liquid diet with nectar thickened liquids --Aspiration precautions  Sepsis, ruled out Patient was initially started on empiric antibiotics with IV vancomycin and cefepime.  Lactic acid elevated likely secondary to dehydration.  Fevers likely secondary to COVID-19 viral infection as above.  No leukocytosis.  Chest x-ray with no active cardiopulmonary disease process, urinalysis unrevealing.  Will discontinue antibiotics for now and monitor fever curve.  Essential hypertension Home medications include metoprolol succinate 12.5 mg p.o. daily, losartan 100 mg p.o. daily, amlodipine 5 mg p.o. daily. --Hold antihypertensives for now --Monitor BP, will start if required  GERD: Continue PPI  HLD: Crestor 5 mg p.o. daily  Dementia --Delirium precautions --Get up during the day --Encourage a familiar face to remain present throughout the day --Keep blinds open and lights on during daylight hours --Minimize the use of opioids/benzodiazepines --Donepezil 10 mg p.o. nightly --Melatonin 5 mg p.o. nightly as needed  Moderate protein calorie malnutrition Adult failure to thrive Patient with significant fat loss/muscle depletion on physical exam. Body mass index is 15.2 kg/m. --Megace 200 mg BID --Dietitian  consult  for assessment, supplementation  Weakness/deconditioning: Per son-in-law, family currently and works for ALF placement at [address]. --PT/OT recommending SNF placement, TOC for placement  DVT prophylaxis: enoxaparin (LOVENOX) injection 30 mg Start: 06/12/22 2200    Code Status: Full Code Family Communication: No family present at bedside this morning, updated patient's daughter and son-in-law via telephone this morning  Disposition Plan:  Level of care: Telemetry Status is: Inpatient Remains inpatient appropriate because: Medically stable for discharge once SNF bed available      Consultants:  Palliative care: Pending  Procedures:  MBS:  Antimicrobials:  Vancomycin 7/4 - 7/5 Cefepime 7/4 - 7/5   Subjective: Patient seen examined at bedside, resting comfortably.  Pleasantly confused.  No family present this morning, updated daughter and son-in-law via telephone this morning.  Patient with no complaints or concerns at this time.  Reports "feel better".  Denies chest pain, no shortness of breath, no abdominal pain, no overall pain.  No acute events overnight per nursing staff.  Objective: Vitals:   06/13/22 1211 06/13/22 1518 06/13/22 2058 06/14/22 0511  BP: (!) 106/55 (!) 112/56 (!) 110/57 111/60  Pulse: (!) 57 62 93 (!) 50  Resp: 16 14 (!) 21 20  Temp: 98.3 F (36.8 C) 98.2 F (36.8 C) 98.1 F (36.7 C) 98.5 F (36.9 C)  TempSrc: Axillary Axillary    SpO2: 97% 96% 98% 96%  Weight:      Height:        Intake/Output Summary (Last 24 hours) at 06/14/2022 1127 Last data filed at 06/14/2022 0800 Gross per 24 hour  Intake 1080 ml  Output 650 ml  Net 430 ml   Filed Weights   06/11/22 1558 06/11/22 1622  Weight: 36.7 kg 37.7 kg    Examination:  Physical Exam: GEN: NAD, alert, pleasantly confused, thin/cachectic in appearance HEENT: NCAT, PERRL, EOMI, sclera clear, MMM PULM: CTAB w/o wheezes/crackles, normal respiratory effort, on room air CV: RRR  w/o M/G/R GI: abd soft, NTND, NABS, no R/G/M MSK: no peripheral edema, moves all extremities independently NEURO: CN II-XII intact, no focal deficits PSYCH: Depressed mood, flat affect Integumentary: dry/intact, no rashes or wounds    Data Reviewed: I have personally reviewed following labs and imaging studies  CBC: Recent Labs  Lab 06/11/22 1613 06/12/22 2030 06/13/22 0520  WBC 8.3 5.5 6.1  NEUTROABS  --   --  4.9  HGB 14.0 13.0 13.4  HCT 40.5 37.3 38.0  MCV 93.8 93.0 92.7  PLT 184 149* 086*   Basic Metabolic Panel: Recent Labs  Lab 06/11/22 1613 06/12/22 2030 06/13/22 0520 06/14/22 0609  NA 132*  --  133* 132*  K 3.9  --  2.9* 3.0*  CL 102  --  99 101  CO2 21*  --  24 25  GLUCOSE 258*  --  82 92  BUN 33*  --  12 14  CREATININE 0.86 0.43* 0.44 0.41*  CALCIUM 8.6*  --  8.3* 7.5*  MG  --   --  1.8 2.1  PHOS  --   --  2.8  --    GFR: Estimated Creatinine Clearance: 30 mL/min (A) (by C-G formula based on SCr of 0.41 mg/dL (L)). Liver Function Tests: Recent Labs  Lab 06/11/22 1613 06/13/22 0520  AST 27 19  ALT 17 15  ALKPHOS 72 54  BILITOT 0.9 0.9  PROT 7.6 6.2*  ALBUMIN 4.0 3.3*   Recent Labs  Lab 06/11/22 1613  LIPASE 49  No results for input(s): "AMMONIA" in the last 168 hours. Coagulation Profile: Recent Labs  Lab 06/12/22 2030  INR 1.1   Cardiac Enzymes: No results for input(s): "CKTOTAL", "CKMB", "CKMBINDEX", "TROPONINI" in the last 168 hours. BNP (last 3 results) No results for input(s): "PROBNP" in the last 8760 hours. HbA1C: Recent Labs    06/13/22 0520  HGBA1C 5.7*   CBG: Recent Labs  Lab 06/13/22 0812 06/13/22 1208 06/13/22 1715 06/13/22 2100 06/14/22 0752  GLUCAP 79 85 103* 147* 101*   Lipid Profile: No results for input(s): "CHOL", "HDL", "LDLCALC", "TRIG", "CHOLHDL", "LDLDIRECT" in the last 72 hours. Thyroid Function Tests: No results for input(s): "TSH", "T4TOTAL", "FREET4", "T3FREE", "THYROIDAB" in the last 72  hours. Anemia Panel: Recent Labs    06/13/22 0520  FERRITIN 101   Sepsis Labs: Recent Labs  Lab 06/11/22 1617 06/11/22 1800 06/13/22 0520  PROCALCITON  --   --  0.21  LATICACIDVEN 2.6* 2.4* 1.2    Recent Results (from the past 240 hour(s))  Culture, blood (Routine x 2)     Status: None (Preliminary result)   Collection Time: 06/11/22  4:00 PM   Specimen: BLOOD  Result Value Ref Range Status   Specimen Description   Final    BLOOD LEFT ANTECUBITAL Performed at [facility], [address] [place], [place] [address]    Special Requests   Final    BOTTLES DRAWN AEROBIC AND ANAEROBIC Blood Culture adequate volume Performed at [facility], [address] [place], [place] [address]    Culture   Final    NO GROWTH 2 DAYS Performed at [facility], [place] [address] [place], [place] [address]    Report Status PENDING  Incomplete  Culture, blood (Routine x 2)     Status: None (Preliminary result)   Collection Time: 06/11/22  4:05 PM   Specimen: BLOOD  Result Value Ref Range Status   Specimen Description   Final    BLOOD RIGHT ANTECUBITAL Performed at [facility], [address] [place], [place] [address]    Special Requests   Final    BOTTLES DRAWN AEROBIC AND ANAEROBIC Blood Culture adequate volume Performed at [facility], [address] [place], [place] [address]    Culture   Final    NO GROWTH 2 DAYS Performed at [facility], [place] [address] [place], [place] [address]    Report Status PENDING  Incomplete  Urine Culture     Status: None   Collection Time: 06/11/22  4:47 PM   Specimen: Urine, Clean Catch  Result Value Ref Range Status   Specimen Description   Final    URINE, CLEAN CATCH Performed at [facility], [address] [place], [place] [address]    Special Requests   Final    NONE Performed at [facility], [address] [place], [place] [address]    Culture   Final     NO GROWTH Performed at [facility], [place] [address] [place], [place] [address]    Report Status 06/13/2022 FINAL  Final  Resp Panel by RT-PCR (Flu A&B, Covid) Anterior Nasal Swab     Status: Abnormal   Collection Time: 06/11/22  5:02 PM   Specimen: Anterior Nasal Swab  Result Value Ref Range Status   SARS Coronavirus 2 by RT PCR POSITIVE (A) NEGATIVE Final    Comment: (  NOTE) SARS-CoV-2 target nucleic acids are DETECTED.  The SARS-CoV-2 RNA is generally detectable in upper respiratory specimens during the acute phase of infection. Positive results are indicative of the presence of the identified virus, but do not rule out bacterial infection or co-infection with other pathogens not detected by the test. Clinical correlation with patient history and other diagnostic information is necessary to determine patient infection status. The expected result is Negative.  Fact Sheet for Patients: EntrepreneurPulse.com.au  Fact Sheet for Healthcare Providers: IncredibleEmployment.be  This test is not yet approved or cleared by the [place] FDA and  has been authorized for detection and/or diagnosis of SARS-CoV-2 by FDA under an Emergency Use Authorization (EUA).  This EUA will remain in effect (meaning this test can be used) for the duration of  the COVID-19 declaration under Section 564(b)(1) of the A ct, 21 U.S.C. section 360bbb-3(b)(1), unless the authorization is terminated or revoked sooner.     Influenza A by PCR NEGATIVE NEGATIVE Final   Influenza B by PCR NEGATIVE NEGATIVE Final    Comment: (NOTE) The Xpert Xpress SARS-CoV-2/FLU/RSV plus assay is intended as an aid in the diagnosis of influenza from Nasopharyngeal swab specimens and should not be used as a sole basis for treatment. Nasal washings and aspirates are unacceptable for Xpert Xpress SARS-CoV-2/FLU/RSV testing.  Fact Sheet for  Patients: EntrepreneurPulse.com.au  Fact Sheet for Healthcare Providers: IncredibleEmployment.be  This test is not yet approved or cleared by the [place] FDA and has been authorized for detection and/or diagnosis of SARS-CoV-2 by FDA under an Emergency Use Authorization (EUA). This EUA will remain in effect (meaning this test can be used) for the duration of the COVID-19 declaration under Section 564(b)(1) of the Act, 21 U.S.C. section 360bbb-3(b)(1), unless the authorization is terminated or revoked.  Performed at [facility], [address] [place], [place] [address]          Radiology Studies: DG Swallowing [facility]  Result Date: 06/13/2022 Table formatting from the original result was not included. Objective Swallowing Evaluation: Type of Study: MBS-Modified Barium Swallow Study  Patient Details Name: Detrice Cales MRN: [id number] Date of Birth: 1933-02-13 Today's Date: 06/13/2022 Time: SLP Start Time (ACUTE ONLY): 1400 -SLP Stop Time (ACUTE ONLY): 1445 SLP Time Calculation (min) (ACUTE ONLY): 45 min Past Medical History: Past Medical History: Diagnosis Date  Hypertension  Past Surgical History: Past Surgical History: Procedure Laterality Date  WHIPPLE PROCEDURE   HPI: Milla Grimley is a 88 y.o. female with medical history significant for dementia, essential hypertension, hyperlipidemia, moderate protein calorie malnutrition, vertigo, throat cancer s/p radiation, who presented to [facility] ED from home due to confusion, poor oral intake, and intermittent fevers.  She tested positive for COVID-19 in the ED.  Her chest x-ray was nonacute.  .  However she was tachypneic and febrile with Tmax of 101.8.  Code sepsis was called in the ED.  Blood cultures were obtained and she was started on broad-spectrum IV antibiotics empirically, cefepime and IV vancomycin. Son-in-law present and advised that pt has had 8 pound weight loss over the  last 3 weeks - and was not able to walk to bathroom 2 days ago.  Swallow evaluation ordered.  Per son-in-law, pt chronically clears her throat when she eats/drinks and has had decreased intake.  She is followed by oncology at [facility]. 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  Pt also with h/o Nissen per son.  Subjective: pt awake in bed, daughter present  Recommendations for follow up therapy are one component of a multi-disciplinary discharge planning process, led by the attending physician.  Recommendations may be updated based on patient status, additional functional criteria and insurance authorization. Assessment / Plan / Recommendation   06/13/2022   3:49 PM Clinical Impressions Clinical Impression Patient with mild oral and moderately severe pharyngeal dysphagia. Oral deficits c/b decreased coordination with oral transiting resulting prolonged oral transiting with solids/lingual pumping with puree and inconsistent premature spillage of liquids into pharynx.  Pharyngeal swallow marked by hypomotility resulting in impaired tongue base retraction, epiglottic deflection and compromised laryngeal closure. Delay in swallow reflex with swallow triggering with solids/puree over epiglottis and spilling to pyriform sinus is precarious.  Suspect iatrogenic impacts of XRT as primary source of pt's dysphagia.  Pharyngeal retention observed across consistencies - more at vallecular space than pyriform sinuses. Patient appears with anterior curvature of cervical spine narrowing pharynx/area between epiglottis and posterior pharynx that may also contribute to dysphagia.  She does not sense retention and was not able to cough/expectorate per verbal and auditory cue.    Pt with chronic aspiration of secretions *observed as they mixed with barium* without airway clearance despite reflexive throat  clearing and cough.  She aspirated a moderate amount of thin liquids as laryngeal closure was compromised and barium spilled into open airway.  Cough was reflexively more pronounced with aspiration of larger amount of thin but still ineffective to clear.  Head turn to the right with slight chin down posture significantly diminished accumulation of retention at vallecular space.     Recommend pt have full liquid diet *nectar via straw and thin via tsp* with strict precautions - including head turn right with slight chin down posture.   Advised daughter and pt to recommendations and concerns that dysphagia has contributed to weight loss. Also reviewed that patient's dysphagia likely has impacted her nutrition and also will be an aspiration risk.  Encouraged meeting with palliative to help establish this patient's GOC and advised to speak to MD re: code status due to level of dysphagia  SLP Visit Diagnosis Dysphagia, pharyngeal phase (R13.13);Dysphagia, pharyngoesophageal phase (R13.14);Dysphagia, unspecified (R13.10) Impact on safety and function Moderate aspiration risk;Risk for inadequate nutrition/hydration;Other (comment)     06/13/2022   3:49 PM Treatment Recommendations Treatment Recommendations Therapy as outlined in treatment plan below     06/13/2022   4:01 PM Prognosis Prognosis for Safe Diet Advancement Guarded Barriers to Reach Goals Severity of deficits;Time post onset   06/13/2022   3:49 PM Diet Recommendations Medication Administration Crushed with puree Compensations Slow rate;Small sips/bites;Multiple dry swallows after each bite/sip     06/13/2022   3:49 PM Other Recommendations Other Recommendations Have oral suction available Follow Up Recommendations Follow physician's recommendations for discharge plan and follow up therapies Assistance recommended at discharge Frequent or constant Supervision/Assistance Functional Status Assessment Patient has had a recent decline in their functional status and/or  demonstrates limited ability to make significant improvements in function in a reasonable and predictable amount of time   06/13/2022   3:49 PM Frequency and Duration  Speech Therapy Frequency (ACUTE ONLY) min 2x/week Treatment Duration 2 weeks     06/13/2022   3:41 PM Oral Phase Oral Phase Impaired    06/13/2022   3:41 PM Pharyngeal Phase Pharyngeal Phase Impaired Pharyngeal- Nectar Teaspoon Reduced pharyngeal peristalsis;Reduced epiglottic inversion;Reduced anterior laryngeal mobility;Reduced laryngeal elevation;Reduced  airway/laryngeal closure;Reduced tongue base retraction;Pharyngeal residue - valleculae;Pharyngeal residue - pyriform;Delayed swallow initiation-vallecula Pharyngeal Material does not enter airway Pharyngeal- Nectar Straw Reduced pharyngeal peristalsis;Reduced epiglottic inversion;Reduced laryngeal elevation;Reduced anterior laryngeal mobility;Reduced airway/laryngeal closure;Reduced tongue base retraction;Pharyngeal residue - valleculae;Delayed swallow initiation-vallecula Pharyngeal Material does not enter airway Pharyngeal- Thin Teaspoon Reduced pharyngeal peristalsis;Reduced epiglottic inversion;Reduced anterior laryngeal mobility;Reduced laryngeal elevation;Reduced airway/laryngeal closure;Reduced tongue base retraction;Pharyngeal residue - valleculae Pharyngeal Material does not enter airway Pharyngeal- Thin Cup Reduced pharyngeal peristalsis;Reduced epiglottic inversion;Reduced anterior laryngeal mobility;Reduced laryngeal elevation;Reduced airway/laryngeal closure;Reduced tongue base retraction;Moderate aspiration;Penetration/Aspiration during swallow;Penetration/Apiration after swallow;Pharyngeal residue - valleculae;Penetration/Aspiration before swallow Pharyngeal Material enters airway, passes BELOW cords and not ejected out despite cough attempt by patient Pharyngeal- Thin Straw Reduced pharyngeal peristalsis;Reduced epiglottic inversion;Reduced anterior laryngeal mobility;Reduced laryngeal  elevation;Reduced airway/laryngeal closure;Reduced tongue base retraction;Penetration/Aspiration during swallow;Penetration/Apiration after swallow Pharyngeal Material enters airway, remains ABOVE vocal cords and not ejected out Pharyngeal- Puree Delayed swallow initiation-vallecula;Other (Comment);Delayed swallow initiation-pyriform sinuses;Pharyngeal residue - valleculae Pharyngeal Material does not enter airway Pharyngeal- Mechanical Soft Delayed swallow initiation-vallecula;Delayed swallow initiation-pyriform sinuses;Pharyngeal residue - valleculae Pharyngeal Material does not enter airway Pharyngeal Comment head turn the right with subtle chin tuck posture helpful to decrease accumulation of barium at vallecular space; pt with some difficulty initiating swallow - most notable dry swallows; reflexive and volitonal cough were not effective to consistently clear penetration nor aspirates    06/13/2022   3:49 PM Cervical Esophageal Phase  Cervical Esophageal Phase Darryll Capers Macario Golds 06/13/2022, 4:06 PM                          Scheduled Meds:  vitamin C  250 mg Oral Daily   calcium-vitamin D  1 tablet Oral Q breakfast   donepezil  10 mg Oral QHS   enoxaparin (LOVENOX) injection  30 mg Subcutaneous Q24H   feeding supplement  1 Container Oral TID BM   insulin aspart  0-5 Units Subcutaneous QHS   insulin aspart  0-9 Units Subcutaneous TID WC   lactose free nutrition  237 mL Oral TID WC   megestrol  200 mg Oral BID   multivitamin with minerals  1 tablet Oral Daily   pantoprazole  80 mg Oral Daily   potassium chloride  40 mEq Oral Q3H   rosuvastatin  5 mg Oral Daily   vitamin B-12  1,000 mcg Oral Daily   zinc sulfate  220 mg Oral Daily   Continuous Infusions:  sodium chloride 50 mL/hr at 06/13/22 1203     LOS: 1 day    Time spent: 49 minutes spent on chart review, discussion with nursing staff, consultants, updating family and interview/physical exam; more than 50% of that time was spent  in counseling and/or coordination of care.    Shaquanna Lycan J [place] DO [facility] Available via Epic secure chat 7am-7pm After these hours, please refer to coverage provider listed on [email or web address] 06/14/2022, 11:27 AM

## 2022-06-14 NOTE — Progress Notes (Signed)
Speech Language Pathology Treatment: Dysphagia  Patient Details Name: Pam Boone MRN: [id number] DOB: 11-02-1933 Today's Date: 06/14/2022 Time: 8828-0034 SLP Time Calculation (min) (ACUTE ONLY): 23 min  Assessment / Plan / Recommendation Clinical Impression  Session focused on addressing dysphagia goals including reviewing fluoroscopy loops with family/patient to contrast normal MBS study vs her exam.  Today patient fully alert with improved mentation per family (daughter and son-in-law).   Using teach back, written precautions and demonstration of strategies, pt and family educated.   Patient did not report adequate understanding of testing, recommendations but both daughter and son-in-law expressed gratitude for testing and information.  Daughter confirms patient's voice is not yet baseline since prior to this current issue.  Per [name], son-in-law, pt really wants solid foods and is upset at her diet restrictions.    Educated family to silent nature of aspiration and importance of monitoring lung sounds, temperatures, congestion, etc. to assure po tolerance.  Reviewed with family use of thickener in drinks and allowance for tsps of thin with head turn right and chint tuck.      HPI HPI: Pam Boone is a 88 y.o. female with medical history significant for dementia, essential hypertension, hyperlipidemia, moderate protein calorie malnutrition, vertigo, throat cancer s/p radiation, who presented to [facility] ED from home due to confusion, poor oral intake, and intermittent fevers.  She tested positive for COVID-19 in the ED.  Her chest x-ray was nonacute.  .  However she was tachypneic and febrile with Tmax of 101.8.  Code sepsis was called in the ED.  Blood cultures were obtained and she was started on broad-spectrum IV antibiotics empirically, cefepime and IV vancomycin. Son-in-law present and advised that pt has had 8 pound weight loss over the last 3 weeks - and was not able to walk to bathroom 2 days  ago.  Swallow evaluation ordered.  Per son-in-law, pt chronically clears her throat when she eats/drinks and has had decreased intake.  She is followed by oncology at [facility]. 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  Pt also with h/o Nissen per son.  Follow up indicated following MBS completed on 06/13/2022.      SLP Plan  Continue with current plan of care      Recommendations for follow up therapy are one component of a multi-disciplinary discharge planning process, led by the attending physician.  Recommendations may be updated based on patient status, additional functional criteria and insurance authorization.    Recommendations  Diet recommendations: Thin liquid;Nectar-thick liquid Medication Administration: Via alternative means (or crushed) Compensations: Slow rate;Small sips/bites;Multiple dry swallows after each bite/sip Postural Changes and/or Swallow Maneuvers: Seated upright 90 degrees;Upright 30-60 min after meal;Head turn right during swallow;Chin tuck                Oral Care Recommendations: Oral care BID Follow Up Recommendations: Skilled nursing-short term rehab (<3 hours/day) Assistance recommended at discharge: Frequent or constant Supervision/Assistance SLP Visit Diagnosis: Dysphagia, pharyngeal phase (R13.13);Dysphagia, pharyngoesophageal phase (R13.14);Dysphagia, unspecified (R13.10) Plan: Continue with current plan of care         Kathleen Lime, MS [place] Office [phone] Pager [phone]   Macario Golds  06/14/2022, 3:51 PM

## 2022-06-15 DIAGNOSIS — U071 COVID-19: Secondary | ICD-10-CM | POA: Diagnosis not present

## 2022-06-15 LAB — BASIC METABOLIC PANEL
Anion gap: 7 (ref 5–15)
BUN: 10 mg/dL (ref 8–23)
CO2: 22 mmol/L (ref 22–32)
Calcium: 7.6 mg/dL — ABNORMAL LOW (ref 8.9–10.3)
Chloride: 100 mmol/L (ref 98–111)
Creatinine, Ser: 0.35 mg/dL — ABNORMAL LOW (ref 0.44–1.00)
GFR, Estimated: >60 mL/min (ref >60)
Glucose, Bld: 91 mg/dL (ref 70–99)
Potassium: 3.3 mmol/L — ABNORMAL LOW (ref 3.5–5.1)
Sodium: 129 mmol/L — ABNORMAL LOW (ref 135–145)

## 2022-06-15 LAB — GLUCOSE, CAPILLARY
Glucose-Capillary: 93 mg/dL (ref 70–99)
Glucose-Capillary: 205 mg/dL — ABNORMAL HIGH (ref 70–99)
Glucose-Capillary: 197 mg/dL — ABNORMAL HIGH (ref 70–99)
Glucose-Capillary: 102 mg/dL — ABNORMAL HIGH (ref 70–99)

## 2022-06-15 LAB — MAGNESIUM: Magnesium: 2 mg/dL (ref 1.7–2.4)

## 2022-06-15 MED ORDER — POTASSIUM CHLORIDE 20 MEQ PO PACK
40.0000 meq | PACK | ORAL | Status: AC
  Administered 2022-06-15 (×2): 40 meq via ORAL
  Filled 2022-06-15 (×2): qty 2

## 2022-06-15 NOTE — TOC Progression Note (Addendum)
Transition of Care [facility]) - Progression Note    Patient Details  Name: Pam Boone MRN: [id number] Date of Birth: 1933/02/22  Transition of Care [facility]) CM/SW [place], LCSW Phone Number: 06/15/2022, 11:19 AM  Clinical Narrative:    Pt and family are wanting SNF placement in [facility] area. CSW has reached out to the following SNF's for potential placement at their facilities: [facility]- No bed availability and no expected availability at end of quarantine  [facility]- Able to accept post quarantine [facility]- To review for potential bed offer following quarantine [place] to accept post quarantine if bed available   Update 1350: Pt is recommended for outpatient palliative care. Pt has been referred to [facility] for these services.      Expected Discharge Plan: Skilled Nursing Facility Barriers to Discharge: SNF Covid  Expected Discharge Plan and Services Expected Discharge Plan: [place] In-house Referral: Clinical Social Work, Hospice / Palliative Care Discharge Planning Services: CM Consult Post Acute Care Choice: [place] Living arrangements for the past 2 months: Single Family Home                 DME Arranged: N/A DME Agency: NA                   Social Determinants of Health (SDOH) Interventions    Readmission Risk Interventions     No data to display

## 2022-06-15 NOTE — Progress Notes (Signed)
Physical Therapy Treatment Patient Details Name: Pam Boone MRN: [id number] DOB: 15-Feb-1933 Today's Date: 06/15/2022   History of Present Illness Pam Boone is a 88 y.o. female with past medical history significant for dementia, essential hypertension, HLD, moderate protein calorie malnutrition, vertigo, throat cancer s/p radiation who initially presented to [facility] ED from home due to confusion, poor oral intake and intermittent fevers.  Family reports that she lives alone but they check on her occasionally throughout the week. They were planning on moving her to an assisted living facility [facility]) in the near future.  They noted that she has not been eating or drinking well over the last 2 weeks, was prior utilizing boost shakes. Found to be COVID positive and admitted.    PT Comments    Co-treat with OT/PT for safety and mobility. Pt required mod assist for bed mobility, min assist for transfers and ambulation in room. Trialed HHA for step-pivot transfer and pt with narrow BOS and highly flexed trunk; provided RW and pt demonstrated much improved gait, upright posture, and was able to correct BOS with verbal cuing. Pt ambulated from bench to toilet to recliner in room, SpO2 monitored and WFL. Pt is progressing and we will continue to follow acutely.          Recommendations for follow up therapy are one component of a multi-disciplinary discharge planning process, led by the attending physician.  Recommendations may be updated based on patient status, additional functional criteria and insurance authorization.  Follow Up Recommendations  Skilled nursing-short term rehab (<3 hours/day) Can patient physically be transported by private vehicle: No   Assistance Recommended at Discharge Frequent or constant Supervision/Assistance  Patient can return home with the following A lot of help with walking and/or transfers;A lot of help with bathing/dressing/bathroom;Assistance with  cooking/housework;Assist for transportation;Help with stairs or ramp for entrance   Equipment Recommendations  None recommended by PT    Recommendations for Other Services       Precautions / Restrictions Precautions Precautions: Fall Precaution Comments: daughter reports she's had 1 fall in the past 6 months, it occured while going up steps to enter her home Restrictions Weight Bearing Restrictions: No     Mobility  Bed Mobility Overal bed mobility: Needs Assistance Bed Mobility: Supine to Sit     Supine to sit: Mod assist Sit to supine: Mod assist   General bed mobility comments: assist to raise trunk via HHA    Transfers Overall transfer level: Needs assistance Equipment used: 1 person hand held assist Transfers: Sit to/from Stand, Bed to chair/wheelchair/BSC Sit to Stand: Mod assist   Step pivot transfers: Mod assist       General transfer comment: Pt required hand hold assist to stand, mod assist for lift assist and balance, completed steps to bench to sit with family and rest.    Ambulation/Gait Ambulation/Gait assistance: +2 safety/equipment, Min guard Gait Distance (Feet): 30 Feet Assistive device: Rolling walker (2 wheels) Gait Pattern/deviations: Step-through pattern, Decreased stride length, Trunk flexed, Narrow base of support Gait velocity: decreased     General Gait Details: Pt ambulated with RW and min guard +2 for safety, no physical assist required or overt LOB noted. Pt demonstrated narrow BOs that she corrected with cuing.   Stairs             Wheelchair Mobility    Modified Rankin (Stroke Patients Only)       Balance Overall balance assessment: Needs assistance Sitting-balance support: No upper extremity supported,  Feet supported Sitting balance-Leahy Scale: Fair     Standing balance support: During functional activity, Reliant on assistive device for balance Standing balance-Leahy Scale: Poor                               Cognition Arousal/Alertness: Awake/alert Behavior During Therapy: WFL for tasks assessed/performed Overall Cognitive Status: History of cognitive impairments - at baseline                                 General Comments: Able to follow commands        Exercises      General Comments        Pertinent Vitals/Pain Pain Assessment Pain Assessment: No/denies pain Breathing: normal Negative Vocalization: none Facial Expression: smiling or inexpressive Body Language: relaxed Consolability: no need to console PAINAD Score: 0    Home Living Family/patient expects to be discharged to:: Unsure Living Arrangements: Alone Available Help at Discharge: Family;Available PRN/intermittently Type of Home: House Home Access: Stairs to enter   Entrance Stairs-Number of Steps: 2   Home Layout: One level Home Equipment: Cane - single point;Grab bars - tub/shower;Grab bars - toilet Additional Comments: family has been working on getting her into [place]. Had a meeting setup for Monday.    Prior Function            PT Goals (current goals can now be found in the care plan section) Acute Rehab PT Goals Patient Stated Goal: SNF per daughter PT Goal Formulation: With patient Time For Goal Achievement: 06/28/22 Potential to Achieve Goals: Fair Progress towards PT goals: Progressing toward goals    Frequency    Min 2X/week      PT Plan Current plan remains appropriate    Co-evaluation PT/OT/SLP Co-Evaluation/Treatment: Yes Reason for Co-Treatment: For patient/therapist safety PT goals addressed during session: Mobility/safety with mobility OT goals addressed during session: ADL's and self-care      AM-PAC PT "6 Clicks" Mobility   Outcome Measure  Help needed turning from your back to your side while in a flat bed without using bedrails?: A Little Help needed moving from lying on your back to sitting on the side of a flat bed without using  bedrails?: A Lot Help needed moving to and from a bed to a chair (including a wheelchair)?: A Lot Help needed standing up from a chair using your arms (e.g., wheelchair or bedside chair)?: A Lot Help needed to walk in hospital room?: Total Help needed climbing 3-5 steps with a railing? : Total 6 Click Score: 11    End of Session Equipment Utilized During Treatment: Gait belt Activity Tolerance: Patient tolerated treatment well Patient left: in chair;with call bell/phone within reach;with family/visitor present;with chair alarm set Nurse Communication: Mobility status PT Visit Diagnosis: Difficulty in walking, not elsewhere classified (R26.2);Other abnormalities of gait and mobility (R26.89)     Time: 8413-2440 PT Time Calculation (min) (ACUTE ONLY): 23 min  Charges:                        Coolidge Breeze, PT, DPT [place] [facility] Office: [phone] Pager: [phone]   Coolidge Breeze 06/15/2022, 1:28 PM

## 2022-06-15 NOTE — Progress Notes (Signed)
   This pt has been referred to our [place] program.This is a home-based Palliative care program that is provided by [facility]. If the pt d/c home then we will follow the pt at home after discharge to assist with any chronic/acute symptom management needs. We utilize her PCP as the attending for this program. She will be getting nursing visits 1-3 times a month and SW support in the home with these services. She will also have after hours nursing support as well. If she goes to a SNF/ALF she will get MD visits to help with any unmanaged symptom needs.    She is eligible for other [facility] services in home with our program in place as well.  Webb Silversmith RN BSN [facility] [phone]

## 2022-06-15 NOTE — Plan of Care (Signed)
No acute events overnight. Problem: Education: Goal: Knowledge of General Education information will improve Description: Including pain rating scale, medication(s)/side effects and non-pharmacologic comfort measures Outcome: Progressing   Problem: Health Behavior/Discharge Planning: Goal: Ability to manage health-related needs will improve Outcome: Progressing   Problem: Clinical Measurements: Goal: Ability to maintain clinical measurements within normal limits will improve Outcome: Progressing Goal: Will remain free from infection Outcome: Progressing Goal: Diagnostic test results will improve Outcome: Progressing Goal: Respiratory complications will improve Outcome: Progressing Goal: Cardiovascular complication will be avoided Outcome: Progressing   Problem: Activity: Goal: Risk for activity intolerance will decrease Outcome: Progressing   Problem: Nutrition: Goal: Adequate nutrition will be maintained Outcome: Progressing   Problem: Coping: Goal: Level of anxiety will decrease Outcome: Progressing   Problem: Elimination: Goal: Will not experience complications related to bowel motility Outcome: Progressing Goal: Will not experience complications related to urinary retention Outcome: Progressing   Problem: Pain Managment: Goal: General experience of comfort will improve Outcome: Progressing   Problem: Safety: Goal: Ability to remain free from injury will improve Outcome: Progressing   Problem: Skin Integrity: Goal: Risk for impaired skin integrity will decrease Outcome: Progressing   Problem: Education: Goal: Ability to describe self-care measures that may prevent or decrease complications (Diabetes Survival Skills Education) will improve Outcome: Progressing Goal: Individualized Educational Video(s) Outcome: Progressing   Problem: Coping: Goal: Ability to adjust to condition or change in health will improve Outcome: Progressing   Problem: Fluid  Volume: Goal: Ability to maintain a balanced intake and output will improve Outcome: Progressing   Problem: Health Behavior/Discharge Planning: Goal: Ability to identify and utilize available resources and services will improve Outcome: Progressing Goal: Ability to manage health-related needs will improve Outcome: Progressing   Problem: Metabolic: Goal: Ability to maintain appropriate glucose levels will improve Outcome: Progressing   Problem: Nutritional: Goal: Maintenance of adequate nutrition will improve Outcome: Progressing Goal: Progress toward achieving an optimal weight will improve Outcome: Progressing   Problem: Skin Integrity: Goal: Risk for impaired skin integrity will decrease Outcome: Progressing   Problem: Tissue Perfusion: Goal: Adequacy of tissue perfusion will improve Outcome: Progressing   Problem: Education: Goal: Knowledge of risk factors and measures for prevention of condition will improve Outcome: Progressing   Problem: Coping: Goal: Psychosocial and spiritual needs will be supported Outcome: Progressing   Problem: Respiratory: Goal: Will maintain a patent airway Outcome: Progressing Goal: Complications related to the disease process, condition or treatment will be avoided or minimized Outcome: Progressing

## 2022-06-15 NOTE — Progress Notes (Signed)
Occupational Therapy Treatment Patient Details Name: Pam Boone MRN: [id number] DOB: Dec 29, 1932 Today's Date: 06/15/2022   History of present illness Pam Boone is a 88 y.o. female with past medical history significant for dementia, essential hypertension, HLD, moderate protein calorie malnutrition, vertigo, throat cancer s/p radiation who initially presented to [facility] ED from home due to confusion, poor oral intake and intermittent fevers.  Family reports that she lives alone but they check on her occasionally throughout the week. They were planning on moving her to an assisted living facility [facility]) in the near future.  They noted that she has not been eating or drinking well over the last 2 weeks, was prior utilizing boost shakes. Found to be COVID positive and admitted.   OT comments  Treatment focused on toileting. OT/PT co-treat for safety and to advance patient's functional mobility. Patient able to ambulate to bathroom with min assist and walker. Patient unsteady and needing verbal instruction. Patient needed assistance for toilet transfer and total assist for toileting due to reliance of hands to steady herself. Patient positioned in recliner and able to drink from cup. Patient making progress. Cont POC.   Recommendations for follow up therapy are one component of a multi-disciplinary discharge planning process, led by the attending physician.  Recommendations may be updated based on patient status, additional functional criteria and insurance authorization.    Follow Up Recommendations  Skilled nursing-short term rehab (<3 hours/day)    Assistance Recommended at Discharge Frequent or constant Supervision/Assistance  Patient can return home with the following  A lot of help with bathing/dressing/bathroom;Assistance with cooking/housework;Help with stairs or ramp for entrance;Direct supervision/assist for medications management;Direct supervision/assist for financial management;Assist  for transportation;A little help with walking and/or transfers   Equipment Recommendations  Other (comment) (TBD)    Recommendations for Other Services      Precautions / Restrictions Precautions Precautions: Fall       Mobility Bed Mobility                    Transfers                         Balance Overall balance assessment: Needs assistance Sitting-balance support: No upper extremity supported, Feet supported Sitting balance-Leahy Scale: Fair     Standing balance support: During functional activity, Reliant on assistive device for balance Standing balance-Leahy Scale: Poor                             ADL either performed or assessed with clinical judgement   ADL Overall ADL's : Needs assistance/impaired Eating/Feeding: Set up;Sitting Eating/Feeding Details (indicate cue type and reason): able to drink from cup with straw seated in recliner                     Toilet Transfer: Minimal assistance;Rolling walker (2 wheels);Grab bars Toilet Transfer Details (indicate cue type and reason): Ambulated to batrhoom with RW and min assist for physical steadying. Min assist to guide hips to toilet, verbal cue to use grab bar. Min assist to power up. Toileting- Clothing Manipulation and Hygiene: Total assistance;Sit to/from stand Toileting - Clothing Manipulation Details (indicate cue type and reason): Total assist for toileting but able to perform on toilet today.     Functional mobility during ADLs: Minimal assistance;Rolling walker (2 wheels)      Extremity/Trunk Assessment Upper Extremity Assessment Upper Extremity Assessment: Generalized weakness  Lower Extremity Assessment Lower Extremity Assessment: Generalized weakness   Cervical / Trunk Assessment Cervical / Trunk Assessment: Kyphotic    Vision   Vision Assessment?: No apparent visual deficits   Perception     Praxis      Cognition Arousal/Alertness:  Awake/alert Behavior During Therapy: WFL for tasks assessed/performed Overall Cognitive Status: History of cognitive impairments - at baseline                                 General Comments: Able to follow commands        Exercises      Shoulder Instructions       General Comments      Pertinent Vitals/ Pain       Pain Assessment Pain Assessment: No/denies pain  Home Living                                          Prior Functioning/Environment              Frequency  Min 2X/week        Progress Toward Goals  OT Goals(current goals can now be found in the care plan section)  Progress towards OT goals: Progressing toward goals  Acute Rehab OT Goals Patient Stated Goal: get stronger and be more indepdnent OT Goal Formulation: With family Time For Goal Achievement: 06/27/22 Potential to Achieve Goals: [place] Discharge plan remains appropriate    Co-evaluation                 AM-PAC OT "6 Clicks" Daily Activity     Outcome Measure   Help from another person eating meals?: A Little Help from another person taking care of personal grooming?: A Little Help from another person toileting, which includes using toliet, bedpan, or urinal?: Total Help from another person bathing (including washing, rinsing, drying)?: A Lot Help from another person to put on and taking off regular upper body clothing?: A Little Help from another person to put on and taking off regular lower body clothing?: A Lot 6 Click Score: 14    End of Session Equipment Utilized During Treatment: Gait belt;Rolling walker (2 wheels)  OT Visit Diagnosis: Muscle weakness (generalized) (M62.81)   Activity Tolerance Patient tolerated treatment well   Patient Left in chair;with call bell/phone within reach   Nurse Communication Mobility status (large BM)        Time: 2956-2130 OT Time Calculation (min): 18 min  Charges: OT General  Charges $OT Visit: 1 Visit OT Treatments $Self Care/Home Management : 8-22 mins  Derl Barrow, OTR/L [place]  Office [phone] Pager: [phone]   Lenward Chancellor 06/15/2022, 11:11 AM

## 2022-06-15 NOTE — Progress Notes (Signed)
PROGRESS NOTE    Pam Boone  ZOX:[id number] DOB: [birth year] DOA: 06/11/2022 PCP: Cathlean Sauer, MD    Brief Narrative:   Pam Boone is a 88 y.o. female with past medical history significant for dementia, essential hypertension, HLD, moderate protein calorie malnutrition, vertigo, throat cancer s/p radiation who initially presented to [place] ED from home due to confusion, poor oral intake and intermittent fevers.  Patient unable to participate in HPI due to her underlying dementia and confusion.  Family reports that she lives alone but they check on her occasionally.  They were planning on moving her to an assisted living facility in the near future.  They noted that she has not been eating or drinking well over the last 2 weeks, was prior utilizing boost shakes.  In the ED, temperature 101.8 F, HR 71, RR 13, BP 105/90, SPO2 95% on room air.  WBC 8.3, hemoglobin 14.0, platelets 184.  Sodium 132, potassium 3.9, chloride 102, CO2 21, glucose 258, BUN 33, creatinine 0.86.  Lipase 49, AST 27, ALT 17, total bilirubin 0.9. High sensitive troponin 7>8.  Lactic acid 2.6.  Urinalysis with negative leukocytes, negative nitrite, rare bacteria, no WBCs.  COVID-19 PCR positive.  Influenza A/B PCR negative.  Chest x-ray with no acute cardiopulmonary disease process, noted chronic lung disease with hyperinflation and scarring.  Blood and urine cultures obtained.  Patient was started on broad-spectrum antibiotics with cefepime and vancomycin.  TRH was consulted for admission given fever, COVID-19 infection; and patient was transferred to [facility].  Assessment & Plan:   Covid-19 viral infection Patient presenting to ED with intermittent fevers, poor oral intake over the last 1-2 weeks per family report. COVID test: + 06/11/2022.  Chest x-ray with no active cardiopulmonary disease process, no oxygen requirement.  Family declining antiviral treatment at this time. --CRP 8.0 --ddimer 1.76 --Continue  supportive care with duonebs PRN, vitamin C, zinc, Tylenol, antitussives --Continue airborne/contact isolation precautions for 10 days from the day of diagnosis  Hyponatremia Sodium 132 on admission, likely secondary to hypovolemic hyponatremia in the setting of dehydration. --repeat BMP in am  Hypokalemia Potassium 3.3, magnesium 2.0 will replete. --Repeat electrolytes in a.m.  Lactic acidosis: Resolved Lactic acid 2.6 on admission.  Etiology likely secondary to dehydration. --LA 2.6>1.2 --Continue IV fluid hydration  Dysphagia --SLP following, appreciate assistance.  Underwent modified barium swallow on 06/13/2022. -- Dysphagia 3/mechanical soft diet, nectar thickened liquids, medications crushed with pure, full supervision with meals --Aspiration precautions  Sepsis, ruled out Patient was initially started on empiric antibiotics with IV vancomycin and cefepime.  Lactic acid elevated likely secondary to dehydration.  Fevers likely secondary to COVID-19 viral infection as above.  No leukocytosis.  Chest x-ray with no active cardiopulmonary disease process, urinalysis unrevealing.  Will discontinue antibiotics for now and monitor fever curve.  Essential hypertension Home medications include metoprolol succinate 12.5 mg p.o. daily, losartan 100 mg p.o. daily, amlodipine 5 mg p.o. daily. --Hold antihypertensives for now; anticipate will discontinue antihypertensives on discharge --Monitor BP, will start if required  GERD: Continue PPI  HLD: Crestor 5 mg p.o. daily  Dementia --Delirium precautions --Get up during the day --Encourage a familiar face to remain present throughout the day --Keep blinds open and lights on during daylight hours --Minimize the use of opioids/benzodiazepines --Donepezil 10 mg p.o. nightly --Melatonin 5 mg p.o. nightly as needed  Moderate protein calorie malnutrition Adult failure to thrive Patient with significant fat loss/muscle depletion on physical  exam. Body mass index  is 15.2 kg/m. Nutrition Status: Nutrition Problem: Inadequate oral intake Etiology: chronic illness, dysphagia (dementia) Signs/Symptoms:  (liquid diet, poor PO intakes reported) Interventions: Boost Plus, MVI, Hormel Shake --Megace 200 mg BID --Dietitian following, supplementation   Weakness/deconditioning: Per son-in-law, family currently and works for ALF placement at [address]. --PT/OT recommending SNF placement, TOC for placement  DVT prophylaxis: enoxaparin (LOVENOX) injection 30 mg Start: 06/12/22 2200    Code Status: DNR Family Communication: No family present at bedside this morning, updated patient's daughter and son-in-law via telephone this morning  Disposition Plan:  Level of care: Telemetry Status is: Inpatient Remains inpatient appropriate because: Medically stable for discharge once SNF bed available      Consultants:  Palliative care: Pending  Procedures:  MBS:  Antimicrobials:  Vancomycin 7/4 - 7/5 Cefepime 7/4 - 7/5   Subjective: Patient seen examined at bedside, resting comfortably.  Sleeping but easy arousable, pleasantly confused.  No family present this morning.  Patient with no complaints or concerns at this time.  Denies chest pain, no shortness of breath, no abdominal pain, no musculoskeletal pain.  No acute events overnight per nursing staff.  Objective: Vitals:   06/14/22 0511 06/14/22 1156 06/14/22 1948 06/15/22 0600  BP: 111/60 120/73 123/65 129/66  Pulse: (!) 50 70 61 62  Resp: '20 20 20 16  '$ Temp: 98.5 F (36.9 C) 97.9 F (36.6 C) 97.8 F (36.6 C) 98.2 F (36.8 C)  TempSrc:  Oral  Oral  SpO2: 96% 98% 99% 99%  Weight:      Height:        Intake/Output Summary (Last 24 hours) at 06/15/2022 1037 Last data filed at 06/15/2022 0810 Gross per 24 hour  Intake 974.76 ml  Output 2051 ml  Net -1076.24 ml   Filed Weights   06/11/22 1558 06/11/22 1622  Weight: 36.7 kg 37.7 kg     Examination:  Physical Exam: GEN: NAD, alert, pleasantly confused, thin/cachectic in appearance HEENT: NCAT, PERRL, EOMI, sclera clear, MMM PULM: CTAB w/o wheezes/crackles, normal respiratory effort, on room air CV: RRR w/o M/G/R GI: abd soft, NTND, NABS, no R/G/M MSK: no peripheral edema, moves all extremities independently NEURO: CN II-XII intact, no focal deficits PSYCH: Depressed mood, flat affect Integumentary: dry/intact, no rashes or wounds    Data Reviewed: I have personally reviewed following labs and imaging studies  CBC: Recent Labs  Lab 06/11/22 1613 06/12/22 2030 06/13/22 0520  WBC 8.3 5.5 6.1  NEUTROABS  --   --  4.9  HGB 14.0 13.0 13.4  HCT 40.5 37.3 38.0  MCV 93.8 93.0 92.7  PLT 184 149* 536*   Basic Metabolic Panel: Recent Labs  Lab 06/11/22 1613 06/12/22 2030 06/13/22 0520 06/14/22 0609 06/15/22 0749  NA 132*  --  133* 132* 129*  K 3.9  --  2.9* 3.0* 3.3*  CL 102  --  99 101 100  CO2 21*  --  '24 25 22  '$ GLUCOSE 258*  --  82 92 91  BUN 33*  --  '12 14 10  '$ CREATININE 0.86 0.43* 0.44 0.41* 0.35*  CALCIUM 8.6*  --  8.3* 7.5* 7.6*  MG  --   --  1.8 2.1 2.0  PHOS  --   --  2.8  --   --    GFR: Estimated Creatinine Clearance: 30 mL/min (A) (by C-G formula based on SCr of 0.35 mg/dL (L)). Liver Function Tests: Recent Labs  Lab 06/11/22 1613 06/13/22 0520  AST 27 19  ALT 17 15  ALKPHOS 72 54  BILITOT 0.9 0.9  PROT 7.6 6.2*  ALBUMIN 4.0 3.3*   Recent Labs  Lab 06/11/22 1613  LIPASE 49   No results for input(s): "AMMONIA" in the last 168 hours. Coagulation Profile: Recent Labs  Lab 06/12/22 2030  INR 1.1   Cardiac Enzymes: No results for input(s): "CKTOTAL", "CKMB", "CKMBINDEX", "TROPONINI" in the last 168 hours. BNP (last 3 results) No results for input(s): "PROBNP" in the last 8760 hours. HbA1C: Recent Labs    06/13/22 0520  HGBA1C 5.7*   CBG: Recent Labs  Lab 06/14/22 0752 06/14/22 1137 06/14/22 1750  06/14/22 1950 06/15/22 0815  GLUCAP 101* 125* 108* 203* 93   Lipid Profile: No results for input(s): "CHOL", "HDL", "LDLCALC", "TRIG", "CHOLHDL", "LDLDIRECT" in the last 72 hours. Thyroid Function Tests: No results for input(s): "TSH", "T4TOTAL", "FREET4", "T3FREE", "THYROIDAB" in the last 72 hours. Anemia Panel: Recent Labs    06/13/22 0520  FERRITIN 101   Sepsis Labs: Recent Labs  Lab 06/11/22 1617 06/11/22 1800 06/13/22 0520  PROCALCITON  --   --  0.21  LATICACIDVEN 2.6* 2.4* 1.2    Recent Results (from the past 240 hour(s))  Culture, blood (Routine x 2)     Status: None (Preliminary result)   Collection Time: 06/11/22  4:00 PM   Specimen: BLOOD  Result Value Ref Range Status   Specimen Description   Final    BLOOD LEFT ANTECUBITAL Performed at [facility], [address] [place], [place] [address]    Special Requests   Final    BOTTLES DRAWN AEROBIC AND ANAEROBIC Blood Culture adequate volume Performed at [facility], [address] [place], [place] [address]    Culture   Final    NO GROWTH 3 DAYS Performed at [facility], [place] [address] [place], [place] [address]    Report Status PENDING  Incomplete  Culture, blood (Routine x 2)     Status: None (Preliminary result)   Collection Time: 06/11/22  4:05 PM   Specimen: BLOOD  Result Value Ref Range Status   Specimen Description   Final    BLOOD RIGHT ANTECUBITAL Performed at [facility], [address] [place], [place] [address]    Special Requests   Final    BOTTLES DRAWN AEROBIC AND ANAEROBIC Blood Culture adequate volume Performed at [facility], [address] [place], [place] [address]    Culture   Final    NO GROWTH 3 DAYS Performed at [facility], [place] [address], [place], [place] [address]    Report Status PENDING  Incomplete  Urine Culture     Status: None   Collection Time: 06/11/22  4:47 PM   Specimen: Urine, Clean Catch  Result  Value Ref Range Status   Specimen Description   Final    URINE, CLEAN CATCH Performed at [facility], [address] [place], [place] [address]    Special Requests   Final    NONE Performed at [facility], [address] [place], [place] [address]    Culture   Final    NO GROWTH Performed at [facility], [place] [address], [place], [place] [address]    Report Status 06/13/2022 FINAL  Final  Resp Panel by RT-PCR (Flu A&B, Covid) Anterior Nasal Swab     Status: Abnormal   Collection Time:  06/11/22  5:02 PM   Specimen: Anterior Nasal Swab  Result Value Ref Range Status   SARS Coronavirus 2 by RT PCR POSITIVE (A) NEGATIVE Final    Comment: (NOTE) SARS-CoV-2 target nucleic acids are DETECTED.  The SARS-CoV-2 RNA is generally detectable in upper respiratory specimens during the acute phase of infection. Positive results are indicative of the presence of the identified virus, but do not rule out bacterial infection or co-infection with other pathogens not detected by the test. Clinical correlation with patient history and other diagnostic information is necessary to determine patient infection status. The expected result is Negative.  Fact Sheet for Patients: EntrepreneurPulse.com.au  Fact Sheet for Healthcare Providers: IncredibleEmployment.be  This test is not yet approved or cleared by the [place] FDA and  has been authorized for detection and/or diagnosis of SARS-CoV-2 by FDA under an Emergency Use Authorization (EUA).  This EUA will remain in effect (meaning this test can be used) for the duration of  the COVID-19 declaration under Section 564(b)(1) of the A ct, 21 U.S.C. section 360bbb-3(b)(1), unless the authorization is terminated or revoked sooner.     Influenza A by PCR NEGATIVE NEGATIVE Final   Influenza B by PCR NEGATIVE NEGATIVE Final    Comment: (NOTE) The Xpert Xpress SARS-CoV-2/FLU/RSV plus  assay is intended as an aid in the diagnosis of influenza from Nasopharyngeal swab specimens and should not be used as a sole basis for treatment. Nasal washings and aspirates are unacceptable for Xpert Xpress SARS-CoV-2/FLU/RSV testing.  Fact Sheet for Patients: EntrepreneurPulse.com.au  Fact Sheet for Healthcare Providers: IncredibleEmployment.be  This test is not yet approved or cleared by the [place] FDA and has been authorized for detection and/or diagnosis of SARS-CoV-2 by FDA under an Emergency Use Authorization (EUA). This EUA will remain in effect (meaning this test can be used) for the duration of the COVID-19 declaration under Section 564(b)(1) of the Act, 21 U.S.C. section 360bbb-3(b)(1), unless the authorization is terminated or revoked.  Performed at [facility], [address] [place], [place] [address]          Radiology Studies: DG Swallowing [facility]  Result Date: 06/13/2022 Table formatting from the original result was not included. Objective Swallowing Evaluation: Type of Study: MBS-Modified Barium Swallow Study  Patient Details Name: Pam Boone MRN: [id number] Date of Birth: 01-06-1933 Today's Date: 06/13/2022 Time: SLP Start Time (ACUTE ONLY): 1400 -SLP Stop Time (ACUTE ONLY): 1445 SLP Time Calculation (min) (ACUTE ONLY): 45 min Past Medical History: Past Medical History: Diagnosis Date  Hypertension  Past Surgical History: Past Surgical History: Procedure Laterality Date  WHIPPLE PROCEDURE   HPI: Pam Boone is a 88 y.o. female with medical history significant for dementia, essential hypertension, hyperlipidemia, moderate protein calorie malnutrition, vertigo, throat cancer s/p radiation, who presented to [facility] ED from home due to confusion, poor oral intake, and intermittent fevers.  She tested positive for COVID-19 in the ED.  Her chest x-ray was nonacute.  .  However she was tachypneic and  febrile with Tmax of 101.8.  Code sepsis was called in the ED.  Blood cultures were obtained and she was started on broad-spectrum IV antibiotics empirically, cefepime and IV vancomycin. Son-in-law present and advised that pt has had 8 pound weight loss over the last 3 weeks - and was not able to walk to bathroom 2 days ago.  Swallow evaluation ordered.  Per son-in-law, pt chronically clears her throat when she eats/drinks and has had decreased intake.  She is followed by oncology at [facility]. 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  Pt also with h/o Nissen per son.  Subjective: pt awake in bed, daughter present  Recommendations for follow up therapy are one component of a multi-disciplinary discharge planning process, led by the attending physician.  Recommendations may be updated based on patient status, additional functional criteria and insurance authorization. Assessment / Plan / Recommendation   06/13/2022   3:49 PM Clinical Impressions Clinical Impression Patient with mild oral and moderately severe pharyngeal dysphagia. Oral deficits c/b decreased coordination with oral transiting resulting prolonged oral transiting with solids/lingual pumping with puree and inconsistent premature spillage of liquids into pharynx.  Pharyngeal swallow marked by hypomotility resulting in impaired tongue base retraction, epiglottic deflection and compromised laryngeal closure. Delay in swallow reflex with swallow triggering with solids/puree over epiglottis and spilling to pyriform sinus is precarious.  Suspect iatrogenic impacts of XRT as primary source of pt's dysphagia.  Pharyngeal retention observed across consistencies - more at vallecular space than pyriform sinuses. Patient appears with anterior curvature of cervical spine narrowing pharynx/area between epiglottis and posterior pharynx that may  also contribute to dysphagia.  She does not sense retention and was not able to cough/expectorate per verbal and auditory cue.    Pt with chronic aspiration of secretions *observed as they mixed with barium* without airway clearance despite reflexive throat clearing and cough.  She aspirated a moderate amount of thin liquids as laryngeal closure was compromised and barium spilled into open airway.  Cough was reflexively more pronounced with aspiration of larger amount of thin but still ineffective to clear.  Head turn to the right with slight chin down posture significantly diminished accumulation of retention at vallecular space.     Recommend pt have full liquid diet *nectar via straw and thin via tsp* with strict precautions - including head turn right with slight chin down posture.   Advised daughter and pt to recommendations and concerns that dysphagia has contributed to weight loss. Also reviewed that patient's dysphagia likely has impacted her nutrition and also will be an aspiration risk.  Encouraged meeting with palliative to help establish this patient's GOC and advised to speak to MD re: code status due to level of dysphagia  SLP Visit Diagnosis Dysphagia, pharyngeal phase (R13.13);Dysphagia, pharyngoesophageal phase (R13.14);Dysphagia, unspecified (R13.10) Impact on safety and function Moderate aspiration risk;Risk for inadequate nutrition/hydration;Other (comment)     06/13/2022   3:49 PM Treatment Recommendations Treatment Recommendations Therapy as outlined in treatment plan below     06/13/2022   4:01 PM Prognosis Prognosis for Safe Diet Advancement Guarded Barriers to Reach Goals Severity of deficits;Time post onset   06/13/2022   3:49 PM Diet Recommendations Medication Administration Crushed with puree Compensations Slow rate;Small sips/bites;Multiple dry swallows after each bite/sip     06/13/2022   3:49 PM Other Recommendations Other Recommendations Have oral suction available Follow Up Recommendations  Follow physician's recommendations for discharge plan and follow up therapies Assistance recommended at discharge Frequent or constant Supervision/Assistance Functional Status Assessment Patient has had a recent decline in their functional status and/or demonstrates limited ability to make significant improvements in function in a reasonable and predictable amount of time   06/13/2022   3:49 PM Frequency and Duration  Speech Therapy Frequency (ACUTE ONLY) min 2x/week Treatment Duration 2 weeks     06/13/2022   3:41 PM  Oral Phase Oral Phase Impaired    06/13/2022   3:41 PM Pharyngeal Phase Pharyngeal Phase Impaired Pharyngeal- Nectar Teaspoon Reduced pharyngeal peristalsis;Reduced epiglottic inversion;Reduced anterior laryngeal mobility;Reduced laryngeal elevation;Reduced airway/laryngeal closure;Reduced tongue base retraction;Pharyngeal residue - valleculae;Pharyngeal residue - pyriform;Delayed swallow initiation-vallecula Pharyngeal Material does not enter airway Pharyngeal- Nectar Straw Reduced pharyngeal peristalsis;Reduced epiglottic inversion;Reduced laryngeal elevation;Reduced anterior laryngeal mobility;Reduced airway/laryngeal closure;Reduced tongue base retraction;Pharyngeal residue - valleculae;Delayed swallow initiation-vallecula Pharyngeal Material does not enter airway Pharyngeal- Thin Teaspoon Reduced pharyngeal peristalsis;Reduced epiglottic inversion;Reduced anterior laryngeal mobility;Reduced laryngeal elevation;Reduced airway/laryngeal closure;Reduced tongue base retraction;Pharyngeal residue - valleculae Pharyngeal Material does not enter airway Pharyngeal- Thin Cup Reduced pharyngeal peristalsis;Reduced epiglottic inversion;Reduced anterior laryngeal mobility;Reduced laryngeal elevation;Reduced airway/laryngeal closure;Reduced tongue base retraction;Moderate aspiration;Penetration/Aspiration during swallow;Penetration/Apiration after swallow;Pharyngeal residue - valleculae;Penetration/Aspiration before  swallow Pharyngeal Material enters airway, passes BELOW cords and not ejected out despite cough attempt by patient Pharyngeal- Thin Straw Reduced pharyngeal peristalsis;Reduced epiglottic inversion;Reduced anterior laryngeal mobility;Reduced laryngeal elevation;Reduced airway/laryngeal closure;Reduced tongue base retraction;Penetration/Aspiration during swallow;Penetration/Apiration after swallow Pharyngeal Material enters airway, remains ABOVE vocal cords and not ejected out Pharyngeal- Puree Delayed swallow initiation-vallecula;Other (Comment);Delayed swallow initiation-pyriform sinuses;Pharyngeal residue - valleculae Pharyngeal Material does not enter airway Pharyngeal- Mechanical Soft Delayed swallow initiation-vallecula;Delayed swallow initiation-pyriform sinuses;Pharyngeal residue - valleculae Pharyngeal Material does not enter airway Pharyngeal Comment head turn the right with subtle chin tuck posture helpful to decrease accumulation of barium at vallecular space; pt with some difficulty initiating swallow - most notable dry swallows; reflexive and volitonal cough were not effective to consistently clear penetration nor aspirates    06/13/2022   3:49 PM Cervical Esophageal Phase  Cervical Esophageal Phase Pam Boone 06/13/2022, 4:06 PM                          Scheduled Meds:  vitamin C  250 mg Oral Daily   calcium-vitamin D  1 tablet Oral Q breakfast   donepezil  10 mg Oral QHS   enoxaparin (LOVENOX) injection  30 mg Subcutaneous Q24H   feeding supplement  237 mL Oral TID BM   insulin aspart  0-5 Units Subcutaneous QHS   insulin aspart  0-9 Units Subcutaneous TID WC   megestrol  200 mg Oral BID   multivitamin with minerals  1 tablet Oral Daily   pantoprazole  80 mg Oral Daily   potassium chloride  40 mEq Oral Q3H   rosuvastatin  5 mg Oral Daily   vitamin B-12  1,000 mcg Oral Daily   zinc sulfate  220 mg Oral Daily   Continuous Infusions:     LOS: 2 days    Time spent: 49  minutes spent on chart review, discussion with nursing staff, consultants, updating family and interview/physical exam; more than 50% of that time was spent in counseling and/or coordination of care.    Madhav Mohon J [place] DO [facility] Available via Epic secure chat 7am-7pm After these hours, please refer to coverage provider listed on [email or web address] 06/15/2022, 10:37 AM

## 2022-06-16 DIAGNOSIS — U071 COVID-19: Secondary | ICD-10-CM | POA: Diagnosis not present

## 2022-06-16 DIAGNOSIS — Z66 Do not resuscitate: Secondary | ICD-10-CM | POA: Diagnosis not present

## 2022-06-16 DIAGNOSIS — Z515 Encounter for palliative care: Secondary | ICD-10-CM | POA: Diagnosis not present

## 2022-06-16 DIAGNOSIS — Z7189 Other specified counseling: Secondary | ICD-10-CM | POA: Diagnosis not present

## 2022-06-16 LAB — BASIC METABOLIC PANEL
Anion gap: 7 (ref 5–15)
BUN: 16 mg/dL (ref 8–23)
CO2: 22 mmol/L (ref 22–32)
Calcium: 8.2 mg/dL — ABNORMAL LOW (ref 8.9–10.3)
Chloride: 101 mmol/L (ref 98–111)
Creatinine, Ser: 0.57 mg/dL (ref 0.44–1.00)
GFR, Estimated: >60 mL/min (ref >60)
Glucose, Bld: 84 mg/dL (ref 70–99)
Potassium: 4 mmol/L (ref 3.5–5.1)
Sodium: 130 mmol/L — ABNORMAL LOW (ref 135–145)

## 2022-06-16 LAB — CULTURE, BLOOD (ROUTINE X 2)
Culture: NO GROWTH
Culture: NO GROWTH
Special Requests: ADEQUATE
Special Requests: ADEQUATE

## 2022-06-16 LAB — GLUCOSE, CAPILLARY
Glucose-Capillary: 90 mg/dL (ref 70–99)
Glucose-Capillary: 197 mg/dL — ABNORMAL HIGH (ref 70–99)
Glucose-Capillary: 192 mg/dL — ABNORMAL HIGH (ref 70–99)
Glucose-Capillary: 101 mg/dL — ABNORMAL HIGH (ref 70–99)

## 2022-06-16 NOTE — Progress Notes (Signed)
  Daily Progress Note   Patient Name: Pam Boone       Date: 06/16/2022 DOB: Oct 22, 1933  Age: 88 y.o. MRN#: [id number] Attending Physician: [place] Danay Mckellar J, DO Primary Care Physician: Cathlean Sauer, MD Admit Date: 06/11/2022 Length of Stay: 3 days  Reason for Consultation/Follow-up: {Reason for Consult:23484}  HPI/Patient Profile:  ***  Subjective:   Subjective: Chart Reviewed. Updates received. Patient Assessed. Created space and opportunity for patient  and family to explore thoughts and feelings regarding current medical situation.  Today's Discussion: ***  Review of Systems  Objective:   Vital Signs:  BP 106/67   Pulse 74   Temp 98.8 F (37.1 C) (Oral)   Resp 20   Ht '5\' 2"'$  (1.575 m)   Wt 37.7 kg   SpO2 97%   BMI 15.20 kg/m   Physical Exam: Physical Exam  Palliative Assessment/Data: ***   Assessment & Plan:   Impression: Present on Admission:  COVID-19 virus infection  ***  SUMMARY OF RECOMMENDATIONS   ***  Symptom Management:  ***  Code Status: {Palliative Code status:23503}  Prognosis: {Palliative Care Prognosis:23504}  Discharge Planning: {Palliative dispostion:23505}  Discussed with: ***  Thank you for allowing [place] to participate in the care of [place] PMT will continue to support holistically.  Time Total: ***  Visit consisted of counseling and education dealing with the complex and emotionally intense issues of symptom management and palliative care in the setting of serious and potentially life-threatening illness. Greater than 50%  of this time was spent counseling and coordinating care related to the above assessment and plan.  Walden Field, NP Palliative Medicine Team  Team Phone # [phone] (Nights/Weekends)  08/08/2021, 8:17 AM

## 2022-06-16 NOTE — Progress Notes (Signed)
PROGRESS NOTE    Pam Boone  TMH:[id number] DOB: 1933/08/01 DOA: 06/11/2022 PCP: Cathlean Sauer, MD    Brief Narrative:   Pam Boone is a 88 y.o. female with past medical history significant for dementia, essential hypertension, HLD, moderate protein calorie malnutrition, vertigo, throat cancer s/p radiation who initially presented to [facility] ED from home due to confusion, poor oral intake and intermittent fevers.  Patient unable to participate in HPI due to her underlying dementia and confusion.  Family reports that she lives alone but they check on her occasionally.  They were planning on moving her to an assisted living facility in the near future.  They noted that she has not been eating or drinking well over the last 2 weeks, was prior utilizing boost shakes.  In the ED, temperature 101.8 F, HR 71, RR 13, BP 105/90, SPO2 95% on room air.  WBC 8.3, hemoglobin 14.0, platelets 184.  Sodium 132, potassium 3.9, chloride 102, CO2 21, glucose 258, BUN 33, creatinine 0.86.  Lipase 49, AST 27, ALT 17, total bilirubin 0.9. High sensitive troponin 7>8.  Lactic acid 2.6.  Urinalysis with negative leukocytes, negative nitrite, rare bacteria, no WBCs.  COVID-19 PCR positive.  Influenza A/B PCR negative.  Chest x-ray with no acute cardiopulmonary disease process, noted chronic lung disease with hyperinflation and scarring.  Blood and urine cultures obtained.  Patient was started on broad-spectrum antibiotics with cefepime and vancomycin.  TRH was consulted for admission given fever, COVID-19 infection; and patient was transferred to [facility].  Assessment & Plan:   Covid-19 viral infection Patient presenting to ED with intermittent fevers, poor oral intake over the last 1-2 weeks per family report. COVID test: + 06/11/2022.  CRP 8.0, D-dimer 1.76.  Chest x-ray with no active cardiopulmonary disease process, no oxygen requirement.  Family declining antiviral treatment at this time. --Continue  supportive care with duonebs PRN, vitamin C, zinc, Tylenol, antitussives --Continue airborne/contact isolation precautions for 10 days from the day of diagnosis  Hyponatremia Sodium 132 on admission, likely secondary to hypovolemic hyponatremia in the setting of dehydration. --Labs pending this morning  Hypokalemia --Labs pending this morning  Lactic acidosis: Resolved Lactic acid 2.6 on admission.  Etiology likely secondary to dehydration. --LA 2.6>1.2  Dysphagia --SLP following, appreciate assistance.  Underwent modified barium swallow on 06/13/2022. -- Dysphagia 3/mechanical soft diet, nectar thickened liquids, medications crushed with pure, full supervision with meals --Aspiration precautions  Sepsis, ruled out Patient was initially started on empiric antibiotics with IV vancomycin and cefepime.  Lactic acid elevated likely secondary to dehydration.  Fevers likely secondary to COVID-19 viral infection as above.  No leukocytosis.  Chest x-ray with no active cardiopulmonary disease process, urinalysis unrevealing.  Will discontinue antibiotics for now and monitor fever curve.  Essential hypertension Home medications include metoprolol succinate 12.5 mg p.o. daily, losartan 100 mg p.o. daily, amlodipine 5 mg p.o. daily. --Hold antihypertensives for now; anticipate will discontinue antihypertensives on discharge --Monitor BP, will start if required  GERD: Continue PPI  HLD: Crestor 5 mg p.o. daily  Dementia --Delirium precautions --Get up during the day --Encourage a familiar face to remain present throughout the day --Keep blinds open and lights on during daylight hours --Minimize the use of opioids/benzodiazepines --Donepezil 10 mg p.o. nightly --Melatonin 5 mg p.o. nightly as needed  Moderate protein calorie malnutrition Adult failure to thrive Patient with significant fat loss/muscle depletion on physical exam. Body mass index is 15.2 kg/m. Nutrition Status: Nutrition  Problem: Inadequate oral  intake Etiology: chronic illness, dysphagia (dementia) Signs/Symptoms:  (liquid diet, poor PO intakes reported) Interventions: Boost Plus, MVI, Hormel Shake --Megace 200 mg BID --Dietitian following, supplementation   Weakness/deconditioning: Per son-in-law, family currently and works for ALF placement at [address]. --PT/OT recommending SNF placement, TOC for placement; medically ready for discharge once SNF bed available, currently waiting to complete quarantine for COVID-19 viral infection to end on 06/21/2022.  DVT prophylaxis: enoxaparin (LOVENOX) injection 30 mg Start: 06/12/22 2200    Code Status: DNR Family Communication: No family present at bedside this morning, updated patient's daughter and son-in-law via telephone this morning  Disposition Plan:  Level of care: Telemetry Status is: Inpatient Remains inpatient appropriate because: Medically stable for discharge once SNF bed available      Consultants:  Palliative care: Pending  Procedures:  MBS:  Antimicrobials:  Vancomycin 7/4 - 7/5 Cefepime 7/4 - 7/5   Subjective: Patient seen examined at bedside, resting comfortably.  Lying in bed.  Remains pleasantly confused.  No complaints this morning.  No family present.  Denies chest pain, no shortness of breath, no abdominal pain, no musculoskeletal pain.  No acute events overnight per nursing staff.  Objective: Vitals:   06/15/22 0600 06/15/22 1157 06/15/22 2014 06/16/22 0546  BP: 129/66 118/86 127/66 122/71  Pulse: 62 90 82 71  Resp: '16 20 16 16  '$ Temp: 98.2 F (36.8 C) 97.9 F (36.6 C) 98.5 F (36.9 C) 98 F (36.7 C)  TempSrc: Oral Oral Oral Oral  SpO2: 99% 98% 98% 98%  Weight:      Height:        Intake/Output Summary (Last 24 hours) at 06/16/2022 1024 Last data filed at 06/16/2022 0900 Gross per 24 hour  Intake 790 ml  Output 301 ml  Net 489 ml   Filed Weights   06/11/22 1558 06/11/22 1622  Weight: 36.7 kg  37.7 kg    Examination:  Physical Exam: GEN: NAD, alert, pleasantly confused, thin/cachectic in appearance HEENT: NCAT, PERRL, EOMI, sclera clear, MMM PULM: CTAB w/o wheezes/crackles, normal respiratory effort, on room air CV: RRR w/o M/G/R GI: abd soft, NTND, NABS, no R/G/M MSK: no peripheral edema, moves all extremities independently NEURO: CN II-XII intact, no focal deficits PSYCH: Depressed mood, flat affect Integumentary: dry/intact, no rashes or wounds    Data Reviewed: I have personally reviewed following labs and imaging studies  CBC: Recent Labs  Lab 06/11/22 1613 06/12/22 2030 06/13/22 0520  WBC 8.3 5.5 6.1  NEUTROABS  --   --  4.9  HGB 14.0 13.0 13.4  HCT 40.5 37.3 38.0  MCV 93.8 93.0 92.7  PLT 184 149* 093*   Basic Metabolic Panel: Recent Labs  Lab 06/11/22 1613 06/12/22 2030 06/13/22 0520 06/14/22 0609 06/15/22 0749  NA 132*  --  133* 132* 129*  K 3.9  --  2.9* 3.0* 3.3*  CL 102  --  99 101 100  CO2 21*  --  '24 25 22  '$ GLUCOSE 258*  --  82 92 91  BUN 33*  --  '12 14 10  '$ CREATININE 0.86 0.43* 0.44 0.41* 0.35*  CALCIUM 8.6*  --  8.3* 7.5* 7.6*  MG  --   --  1.8 2.1 2.0  PHOS  --   --  2.8  --   --    GFR: Estimated Creatinine Clearance: 30 mL/min (A) (by C-G formula based on SCr of 0.35 mg/dL (L)). Liver Function Tests: Recent Labs  Lab 06/11/22 1613 06/13/22 0520  AST 27 19  ALT 17 15  ALKPHOS 72 54  BILITOT 0.9 0.9  PROT 7.6 6.2*  ALBUMIN 4.0 3.3*   Recent Labs  Lab 06/11/22 1613  LIPASE 49   No results for input(s): "AMMONIA" in the last 168 hours. Coagulation Profile: Recent Labs  Lab 06/12/22 2030  INR 1.1   Cardiac Enzymes: No results for input(s): "CKTOTAL", "CKMB", "CKMBINDEX", "TROPONINI" in the last 168 hours. BNP (last 3 results) No results for input(s): "PROBNP" in the last 8760 hours. HbA1C: No results for input(s): "HGBA1C" in the last 72 hours.  CBG: Recent Labs  Lab 06/15/22 0815 06/15/22 1219  06/15/22 1654 06/15/22 2204 06/16/22 0809  GLUCAP 93 205* 197* 102* 90   Lipid Profile: No results for input(s): "CHOL", "HDL", "LDLCALC", "TRIG", "CHOLHDL", "LDLDIRECT" in the last 72 hours. Thyroid Function Tests: No results for input(s): "TSH", "T4TOTAL", "FREET4", "T3FREE", "THYROIDAB" in the last 72 hours. Anemia Panel: No results for input(s): "VITAMINB12", "FOLATE", "FERRITIN", "TIBC", "IRON", "RETICCTPCT" in the last 72 hours.  Sepsis Labs: Recent Labs  Lab 06/11/22 1617 06/11/22 1800 06/13/22 0520  PROCALCITON  --   --  0.21  LATICACIDVEN 2.6* 2.4* 1.2    Recent Results (from the past 240 hour(s))  Culture, blood (Routine x 2)     Status: None   Collection Time: 06/11/22  4:00 PM   Specimen: BLOOD  Result Value Ref Range Status   Specimen Description   Final    BLOOD LEFT ANTECUBITAL Performed at [facility], [address] [place], [place] [address]    Special Requests   Final    BOTTLES DRAWN AEROBIC AND ANAEROBIC Blood Culture adequate volume Performed at [facility], [address] [place], [place] [address]    Culture   Final    NO GROWTH 5 DAYS Performed at [facility], [place] [address] [place], [place] [address]    Report Status 06/16/2022 FINAL  Final  Culture, blood (Routine x 2)     Status: None   Collection Time: 06/11/22  4:05 PM   Specimen: BLOOD  Result Value Ref Range Status   Specimen Description   Final    BLOOD RIGHT ANTECUBITAL Performed at [facility], [address] [place], [place] [address]    Special Requests   Final    BOTTLES DRAWN AEROBIC AND ANAEROBIC Blood Culture adequate volume Performed at [facility], [address] [place], [place] [address]    Culture   Final    NO GROWTH 5 DAYS Performed at [facility], [place] [address], [place], [place] [address]    Report Status 06/16/2022 FINAL  Final  Urine Culture     Status: None   Collection Time: 06/11/22  4:47  PM   Specimen: Urine, Clean Catch  Result Value Ref Range Status   Specimen Description   Final    URINE, CLEAN CATCH Performed at [facility], [address] [place], [place] [address]    Special Requests   Final    NONE Performed at [facility], [address] [place], [place] [address]    Culture   Final    NO GROWTH Performed at [facility], [place] [address], [place], [place] [address]    Report Status 06/13/2022 FINAL  Final  Resp Panel by RT-PCR (Flu A&B, Covid) Anterior Nasal Swab     Status:  Abnormal   Collection Time: 06/11/22  5:02 PM   Specimen: Anterior Nasal Swab  Result Value Ref Range Status   SARS Coronavirus 2 by RT PCR POSITIVE (A) NEGATIVE Final    Comment: (NOTE) SARS-CoV-2 target nucleic acids are DETECTED.  The SARS-CoV-2 RNA is generally detectable in upper respiratory specimens during the acute phase of infection. Positive results are indicative of the presence of the identified virus, but do not rule out bacterial infection or co-infection with other pathogens not detected by the test. Clinical correlation with patient history and other diagnostic information is necessary to determine patient infection status. The expected result is Negative.  Fact Sheet for Patients: EntrepreneurPulse.com.au  Fact Sheet for Healthcare Providers: IncredibleEmployment.be  This test is not yet approved or cleared by the [place] FDA and  has been authorized for detection and/or diagnosis of SARS-CoV-2 by FDA under an Emergency Use Authorization (EUA).  This EUA will remain in effect (meaning this test can be used) for the duration of  the COVID-19 declaration under Section 564(b)(1) of the A ct, 21 U.S.C. section 360bbb-3(b)(1), unless the authorization is terminated or revoked sooner.     Influenza A by PCR NEGATIVE NEGATIVE Final   Influenza B by PCR NEGATIVE NEGATIVE Final    Comment:  (NOTE) The Xpert Xpress SARS-CoV-2/FLU/RSV plus assay is intended as an aid in the diagnosis of influenza from Nasopharyngeal swab specimens and should not be used as a sole basis for treatment. Nasal washings and aspirates are unacceptable for Xpert Xpress SARS-CoV-2/FLU/RSV testing.  Fact Sheet for Patients: EntrepreneurPulse.com.au  Fact Sheet for Healthcare Providers: IncredibleEmployment.be  This test is not yet approved or cleared by the [place] FDA and has been authorized for detection and/or diagnosis of SARS-CoV-2 by FDA under an Emergency Use Authorization (EUA). This EUA will remain in effect (meaning this test can be used) for the duration of the COVID-19 declaration under Section 564(b)(1) of the Act, 21 U.S.C. section 360bbb-3(b)(1), unless the authorization is terminated or revoked.  Performed at [facility], [address] [place], [place] [address]          Radiology Studies: No results found.      Scheduled Meds:  vitamin C  250 mg Oral Daily   calcium-vitamin D  1 tablet Oral Q breakfast   donepezil  10 mg Oral QHS   enoxaparin (LOVENOX) injection  30 mg Subcutaneous Q24H   feeding supplement  237 mL Oral TID BM   insulin aspart  0-5 Units Subcutaneous QHS   insulin aspart  0-9 Units Subcutaneous TID WC   megestrol  200 mg Oral BID   multivitamin with minerals  1 tablet Oral Daily   pantoprazole  80 mg Oral Daily   rosuvastatin  5 mg Oral Daily   vitamin B-12  1,000 mcg Oral Daily   zinc sulfate  220 mg Oral Daily   Continuous Infusions:     LOS: 3 days    Time spent: 46 minutes spent on chart review, discussion with nursing staff, consultants, updating family and interview/physical exam; more than 50% of that time was spent in counseling and/or coordination of care.    Phynix Horton J [place] DO [facility] Available via Epic secure chat 7am-7pm After these hours, please refer to  coverage provider listed on [email or web address] 06/16/2022, 10:24 AM

## 2022-06-16 NOTE — Progress Notes (Signed)
Speech Language Pathology Treatment: Dysphagia  Patient Details Name: Pam Boone MRN: [id number] DOB: 1933/03/15 Today's Date: 06/16/2022 Time: 6546-5035 SLP Time Calculation (min) (ACUTE ONLY): 20 min  Assessment / Plan / Recommendation Clinical Impression  SLP session to focus on swallowing function and to start RMST to improve laryngeal closure/pharyngeal contraction for maximizing airway protection with po.  Phillilps Respironics PEP initiated clincially set at 5 cmH2O pressure - Pt required intially total cues to conduct, fading to moderate over time. Pt able to complete 6 repetitions but this was tiring for her.  PEP device cause pt to consistently cough post-exercise.  Cough was not productive desptite reflexively conducting.  She will require frequent repetitions to establish adequacy of task completion.  Pt also observed with po trials including Ensure following exercises.  Max cues required for adequate positioning including head turn and chin tuck.  Immediate throat clearing and delayed cough present with 1/4 boluses.  Pt closed her eyes after 4 bolus- and advised she wanted to rest.   She appears sleepy today - - and admits to not feeling as well as normal.  Recommend follow up at SNF and strict aspiration/esophageal precautions with full supervision for all po intake. Pt reports swallow function to be the same as baseline.  She is making progress and reports being content with her diet advancement.  Note her code status is not DNR and she is suspectible to recurrent aspiraiton pna and FTT due to chronicity of dysphagia.    HPI HPI: Pam Boone is a 88 y.o. female with medical history significant for dementia, essential hypertension, hyperlipidemia, moderate protein calorie malnutrition, vertigo, throat cancer s/p radiation, who presented to [facility] ED from home due to confusion, poor oral intake, and intermittent fevers.  She tested positive for COVID-19 in the ED.  Her chest x-ray was  nonacute.  .  However she was tachypneic and febrile with Tmax of 101.8.  Code sepsis was called in the ED.  Blood cultures were obtained and she was started on broad-spectrum IV antibiotics empirically, cefepime and IV vancomycin. Son-in-law present and advised that pt has had 8 pound weight loss over the last 3 weeks - and was not able to walk to bathroom 2 days ago.  Swallow evaluation ordered.  Per son-in-law, pt chronically clears her throat when she eats/drinks and has had decreased intake.  She is followed by oncology at [facility]. 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  Pt also with h/o Nissen per son.  Follow up indicated following MBS completed on 06/13/2022.      SLP Plan  Continue with current plan of care      Recommendations for follow up therapy are one component of a multi-disciplinary discharge planning process, led by the attending physician.  Recommendations may be updated based on patient status, additional functional criteria and insurance authorization.    Recommendations  Diet recommendations: Nectar-thick liquid;Thin liquid;Dysphagia 3 (mechanical soft) Liquids provided via: Straw;Teaspoon Medication Administration: Crushed with puree (or crushed) Supervision: Full supervision/cueing for compensatory strategies;Trained caregiver to feed patient Compensations: Slow rate;Small sips/bites;Chin tuck Postural Changes and/or Swallow Maneuvers: Seated upright 90 degrees;Upright 30-60 min after meal;Head turn right during swallow;Chin tuck                Oral Care Recommendations: Oral care BID Follow Up Recommendations: Skilled nursing-short term rehab (<3 hours/day) Assistance recommended at discharge: Frequent  or constant Supervision/Assistance SLP Visit Diagnosis: Dysphagia, pharyngeal phase (R13.13);Dysphagia, pharyngoesophageal phase  (R13.14);Dysphagia, unspecified (R13.10) Plan: Continue with current plan of care          Kathleen Lime, MS [place] Office [phone] Pager [phone]  Macario Golds  06/16/2022, 2:36 PM

## 2022-06-17 ENCOUNTER — Encounter (HOSPITAL_COMMUNITY): Payer: Self-pay | Admitting: Internal Medicine

## 2022-06-17 DIAGNOSIS — U071 COVID-19: Secondary | ICD-10-CM | POA: Diagnosis not present

## 2022-06-17 LAB — GLUCOSE, CAPILLARY
Glucose-Capillary: 94 mg/dL (ref 70–99)
Glucose-Capillary: 146 mg/dL — ABNORMAL HIGH (ref 70–99)
Glucose-Capillary: 198 mg/dL — ABNORMAL HIGH (ref 70–99)
Glucose-Capillary: 157 mg/dL — ABNORMAL HIGH (ref 70–99)

## 2022-06-17 NOTE — Plan of Care (Signed)
  Problem: Nutrition: Goal: Adequate nutrition will be maintained Outcome: Progressing   Problem: Elimination: Goal: Will not experience complications related to bowel motility Outcome: Progressing   Problem: Pain Managment: Goal: General experience of comfort will improve Outcome: Progressing   

## 2022-06-17 NOTE — Plan of Care (Signed)
  Problem: Education: Goal: Knowledge of General Education information will improve Description: Including pain rating scale, medication(s)/side effects and non-pharmacologic comfort measures Outcome: Progressing   Problem: Health Behavior/Discharge Planning: Goal: Ability to manage health-related needs will improve Outcome: Progressing   Problem: Clinical Measurements: Goal: Ability to maintain clinical measurements within normal limits will improve Outcome: Progressing Goal: Will remain free from infection Outcome: Progressing Goal: Diagnostic test results will improve Outcome: Progressing Goal: Respiratory complications will improve Outcome: Progressing Goal: Cardiovascular complication will be avoided Outcome: Progressing   Problem: Activity: Goal: Risk for activity intolerance will decrease Outcome: Progressing   Problem: Nutrition: Goal: Adequate nutrition will be maintained Outcome: Progressing   Problem: Coping: Goal: Level of anxiety will decrease Outcome: Progressing   Problem: Elimination: Goal: Will not experience complications related to bowel motility Outcome: Progressing Goal: Will not experience complications related to urinary retention Outcome: Progressing   Problem: Pain Managment: Goal: General experience of comfort will improve Outcome: Progressing   Problem: Safety: Goal: Ability to remain free from injury will improve Outcome: Progressing   Problem: Skin Integrity: Goal: Risk for impaired skin integrity will decrease Outcome: Progressing   Problem: Education: Goal: Ability to describe self-care measures that may prevent or decrease complications (Diabetes Survival Skills Education) will improve Outcome: Progressing Goal: Individualized Educational Video(s) Outcome: Progressing   Problem: Coping: Goal: Ability to adjust to condition or change in health will improve Outcome: Progressing   Problem: Fluid Volume: Goal: Ability to  maintain a balanced intake and output will improve Outcome: Progressing   Problem: Health Behavior/Discharge Planning: Goal: Ability to identify and utilize available resources and services will improve Outcome: Progressing Goal: Ability to manage health-related needs will improve Outcome: Progressing   Problem: Metabolic: Goal: Ability to maintain appropriate glucose levels will improve Outcome: Progressing   Problem: Nutritional: Goal: Maintenance of adequate nutrition will improve Outcome: Progressing Goal: Progress toward achieving an optimal weight will improve Outcome: Progressing   Problem: Skin Integrity: Goal: Risk for impaired skin integrity will decrease Outcome: Progressing   Problem: Tissue Perfusion: Goal: Adequacy of tissue perfusion will improve Outcome: Progressing   Problem: Education: Goal: Knowledge of risk factors and measures for prevention of condition will improve Outcome: Progressing   Problem: Coping: Goal: Psychosocial and spiritual needs will be supported Outcome: Progressing   Problem: Respiratory: Goal: Will maintain a patent airway Outcome: Progressing Goal: Complications related to the disease process, condition or treatment will be avoided or minimized Outcome: Progressing   

## 2022-06-17 NOTE — Progress Notes (Signed)
PROGRESS NOTE    Pam Boone  XIP:[id number] DOB: [birth year] DOA: 06/11/2022 PCP: Cathlean Sauer, MD    Brief Narrative:   Pam Boone is a 88 y.o. female with past medical history significant for dementia, essential hypertension, HLD, moderate protein calorie malnutrition, vertigo, throat cancer s/p radiation who initially presented to [facility] from home due to confusion, poor oral intake and intermittent fevers.  Patient unable to participate in HPI due to her underlying dementia and confusion.  Family reports that she lives alone but they check on her occasionally.  They were planning on moving her to an assisted living facility in the near future.  They noted that she has not been eating or drinking well over the last 2 weeks, was prior utilizing boost shakes.  In the ED, temperature 101.8 F, HR 71, RR 13, BP 105/90, SPO2 95% on room air.  WBC 8.3, hemoglobin 14.0, platelets 184.  Sodium 132, potassium 3.9, chloride 102, CO2 21, glucose 258, BUN 33, creatinine 0.86.  Lipase 49, AST 27, ALT 17, total bilirubin 0.9. High sensitive troponin 7>8.  Lactic acid 2.6.  Urinalysis with negative leukocytes, negative nitrite, rare bacteria, no WBCs.  COVID-19 PCR positive.  Influenza A/B PCR negative.  Chest x-ray with no acute cardiopulmonary disease process, noted chronic lung disease with hyperinflation and scarring.  Blood and urine cultures obtained.  Patient was started on broad-spectrum antibiotics with cefepime and vancomycin.  TRH was consulted for admission given fever, COVID-19 infection; and patient was transferred to [facility].  Assessment & Plan:   Covid-19 viral infection Patient presenting to ED with intermittent fevers, poor oral intake over the last 1-2 weeks per family report. COVID test: + 06/11/2022.  CRP 8.0, D-dimer 1.76.  Chest x-ray with no active cardiopulmonary disease process, no oxygen requirement.  Family declining antiviral treatment at this time. --Continue  supportive care with duonebs PRN, vitamin C, zinc, Tylenol, antitussives --Continue airborne/contact isolation precautions for 10 days from the day of diagnosis  Hyponatremia Sodium 132 on admission, likely secondary to hypovolemic hyponatremia in the setting of dehydration.  Continue to encourage increased oral intake  Hypokalemia Repleted, potassium 4.0 yesterday.  Lactic acidosis: Resolved Lactic acid 2.6 on admission.  Etiology likely secondary to dehydration. --LA 2.6>1.2  Dysphagia --SLP following, appreciate assistance.  Underwent modified barium swallow on 06/13/2022. -- Dysphagia 3/mechanical soft diet, nectar thickened liquids, medications crushed with pure, full supervision with meals --Aspiration precautions  Sepsis, ruled out Patient was initially started on empiric antibiotics with IV vancomycin and cefepime.  Lactic acid elevated likely secondary to dehydration.  Fevers likely secondary to COVID-19 viral infection as above.  No leukocytosis.  Chest x-ray with no active cardiopulmonary disease process, urinalysis unrevealing.  Will discontinue antibiotics for now and monitor fever curve.  Essential hypertension Home medications include metoprolol succinate 12.5 mg p.o. daily, losartan 100 mg p.o. daily, amlodipine 5 mg p.o. daily. --Hold antihypertensives for now; anticipate will discontinue antihypertensives on discharge --Monitor BP, will start if required  GERD: Continue PPI  HLD: Crestor 5 mg p.o. daily  Dementia --Delirium precautions --Get up during the day --Encourage a familiar face to remain present throughout the day --Keep blinds open and lights on during daylight hours --Minimize the use of opioids/benzodiazepines --Donepezil 10 mg p.o. nightly --Melatonin 5 mg p.o. nightly as needed  Moderate protein calorie malnutrition Adult failure to thrive Patient with significant fat loss/muscle depletion on physical exam. Body mass index is 15.2  kg/m. Nutrition Status: Nutrition  Problem: Inadequate oral intake Etiology: chronic illness, dysphagia (dementia) Signs/Symptoms:  (liquid diet, poor PO intakes reported) Interventions: Boost Plus, MVI, Hormel Shake --Megace 200 mg BID --Dietitian following, supplementation  Weakness/deconditioning: Per son-in-law, family currently and works for ALF placement at [address]. --PT/OT recommending SNF placement, TOC for placement; medically ready for discharge once SNF bed available, currently waiting to complete quarantine for COVID-19 viral infection to end on 06/21/2022.  DVT prophylaxis: enoxaparin (LOVENOX) injection 30 mg Start: 06/12/22 2200    Code Status: DNR Family Communication: No family present at bedside this morning  Disposition Plan:  Level of care: Med-Surg Status is: Inpatient Remains inpatient appropriate because: Medically stable for discharge once SNF bed available; likely need to wait 10-day quarantine.  Before discharge      Consultants:  Palliative care: Pending  Procedures:  MBS:  Antimicrobials:  Vancomycin 7/4 - 7/5 Cefepime 7/4 - 7/5   Subjective: Patient seen examined at bedside, resting comfortably.  Lying in bed.  Remains pleasantly confused.  No complaints this morning.  No family present.  Denies chest pain, no shortness of breath, no abdominal pain, no musculoskeletal pain.  No acute events overnight per nursing staff.  Objective: Vitals:   06/16/22 1243 06/16/22 1715 06/16/22 2054 06/17/22 0614  BP: 106/67  110/61 118/65  Pulse: 74  67 65  Resp: '20  16 16  '$ Temp: 98.8 F (37.1 C)  98.2 F (36.8 C) 97.8 F (36.6 C)  TempSrc: Oral  Axillary Oral  SpO2: 97%  97% 97%  Weight:      Height:  '5\' 2"'$  (1.575 m)      Intake/Output Summary (Last 24 hours) at 06/17/2022 1053 Last data filed at 06/17/2022 0500 Gross per 24 hour  Intake 757 ml  Output 1800 ml  Net -1043 ml   Filed Weights   06/11/22 1558 06/11/22 1622  Weight:  36.7 kg 37.7 kg    Examination:  Physical Exam: GEN: NAD, alert, pleasantly confused, thin/cachectic in appearance HEENT: NCAT, PERRL, EOMI, sclera clear, MMM PULM: CTAB w/o wheezes/crackles, normal respiratory effort, on room air CV: RRR w/o M/G/R GI: abd soft, NTND, NABS, no R/G/M MSK: no peripheral edema, moves all extremities independently NEURO: CN II-XII intact, no focal deficits PSYCH: Depressed mood, flat affect Integumentary: dry/intact, no rashes or wounds    Data Reviewed: I have personally reviewed following labs and imaging studies  CBC: Recent Labs  Lab 06/11/22 1613 06/12/22 2030 06/13/22 0520  WBC 8.3 5.5 6.1  NEUTROABS  --   --  4.9  HGB 14.0 13.0 13.4  HCT 40.5 37.3 38.0  MCV 93.8 93.0 92.7  PLT 184 149* 109*   Basic Metabolic Panel: Recent Labs  Lab 06/11/22 1613 06/12/22 2030 06/13/22 0520 06/14/22 0609 06/15/22 0749 06/16/22 0715  NA 132*  --  133* 132* 129* 130*  K 3.9  --  2.9* 3.0* 3.3* 4.0  CL 102  --  99 101 100 101  CO2 21*  --  '24 25 22 22  '$ GLUCOSE 258*  --  82 92 91 84  BUN 33*  --  '12 14 10 16  '$ CREATININE 0.86 0.43* 0.44 0.41* 0.35* 0.57  CALCIUM 8.6*  --  8.3* 7.5* 7.6* 8.2*  MG  --   --  1.8 2.1 2.0  --   PHOS  --   --  2.8  --   --   --    GFR: Estimated Creatinine Clearance: 30 mL/min (by C-G formula based  on SCr of 0.57 mg/dL). Liver Function Tests: Recent Labs  Lab 06/11/22 1613 06/13/22 0520  AST 27 19  ALT 17 15  ALKPHOS 72 54  BILITOT 0.9 0.9  PROT 7.6 6.2*  ALBUMIN 4.0 3.3*   Recent Labs  Lab 06/11/22 1613  LIPASE 49   No results for input(s): "AMMONIA" in the last 168 hours. Coagulation Profile: Recent Labs  Lab 06/12/22 2030  INR 1.1   Cardiac Enzymes: No results for input(s): "CKTOTAL", "CKMB", "CKMBINDEX", "TROPONINI" in the last 168 hours. BNP (last 3 results) No results for input(s): "PROBNP" in the last 8760 hours. HbA1C: No results for input(s): "HGBA1C" in the last 72  hours.  CBG: Recent Labs  Lab 06/16/22 0809 06/16/22 1134 06/16/22 1627 06/16/22 2050 06/17/22 0732  GLUCAP 90 197* 192* 101* 94   Lipid Profile: No results for input(s): "CHOL", "HDL", "LDLCALC", "TRIG", "CHOLHDL", "LDLDIRECT" in the last 72 hours. Thyroid Function Tests: No results for input(s): "TSH", "T4TOTAL", "FREET4", "T3FREE", "THYROIDAB" in the last 72 hours. Anemia Panel: No results for input(s): "VITAMINB12", "FOLATE", "FERRITIN", "TIBC", "IRON", "RETICCTPCT" in the last 72 hours.  Sepsis Labs: Recent Labs  Lab 06/11/22 1617 06/11/22 1800 06/13/22 0520  PROCALCITON  --   --  0.21  LATICACIDVEN 2.6* 2.4* 1.2    Recent Results (from the past 240 hour(s))  Culture, blood (Routine x 2)     Status: None   Collection Time: 06/11/22  4:00 PM   Specimen: BLOOD  Result Value Ref Range Status   Specimen Description   Final    BLOOD LEFT ANTECUBITAL Performed at [facility], [address] [place], [place] [address]    Special Requests   Final    BOTTLES DRAWN AEROBIC AND ANAEROBIC Blood Culture adequate volume Performed at [facility], [address], [place], [place] [address]    Culture   Final    NO GROWTH 5 DAYS Performed at [facility], [place] [address], [place], [place] [address]    Report Status 06/16/2022 FINAL  Final  Culture, blood (Routine x 2)     Status: None   Collection Time: 06/11/22  4:05 PM   Specimen: BLOOD  Result Value Ref Range Status   Specimen Description   Final    BLOOD RIGHT ANTECUBITAL Performed at [facility], [address] [place], [place] [address]    Special Requests   Final    BOTTLES DRAWN AEROBIC AND ANAEROBIC Blood Culture adequate volume Performed at [facility], [address] [place], [place] [address]    Culture   Final    NO GROWTH 5 DAYS Performed at [facility], [place] [address], [place], [place] [address]    Report Status 06/16/2022 FINAL  Final   Urine Culture     Status: None   Collection Time: 06/11/22  4:47 PM   Specimen: Urine, Clean Catch  Result Value Ref Range Status   Specimen Description   Final    URINE, CLEAN CATCH Performed at [facility], [address] [place], [place] [address]    Special Requests   Final    NONE Performed at [facility], [address] [place], [place] [address]    Culture   Final    NO GROWTH Performed at [facility], [place] [address], [place], [place] [address]    Report Status 06/13/2022 FINAL  Final  Resp Panel by RT-PCR (Flu A&B, Covid) Anterior Nasal Swab     Status: Abnormal   Collection Time: 06/11/22  5:02 PM   Specimen: Anterior Nasal Swab  Result Value Ref Range Status   SARS Coronavirus 2 by RT PCR POSITIVE (A) NEGATIVE Final    Comment: (NOTE) SARS-CoV-2 target nucleic acids are DETECTED.  The SARS-CoV-2 RNA is generally detectable in upper respiratory specimens during the acute phase of infection. Positive results are indicative of the presence of the identified virus, but do not rule out bacterial infection or co-infection with other pathogens not detected by the test. Clinical correlation with patient history and other diagnostic information is necessary to determine patient infection status. The expected result is Negative.  Fact Sheet for Patients: EntrepreneurPulse.com.au  Fact Sheet for Healthcare Providers: IncredibleEmployment.be  This test is not yet approved or cleared by the [place] FDA and  has been authorized for detection and/or diagnosis of SARS-CoV-2 by FDA under an Emergency Use Authorization (EUA).  This EUA will remain in effect (meaning this test can be used) for the duration of  the COVID-19 declaration under Section 564(b)(1) of the A ct, 21 U.S.C. section 360bbb-3(b)(1), unless the authorization is terminated or revoked sooner.     Influenza A by PCR NEGATIVE NEGATIVE  Final   Influenza B by PCR NEGATIVE NEGATIVE Final    Comment: (NOTE) The Xpert Xpress SARS-CoV-2/FLU/RSV plus assay is intended as an aid in the diagnosis of influenza from Nasopharyngeal swab specimens and should not be used as a sole basis for treatment. Nasal washings and aspirates are unacceptable for Xpert Xpress SARS-CoV-2/FLU/RSV testing.  Fact Sheet for Patients: EntrepreneurPulse.com.au  Fact Sheet for Healthcare Providers: IncredibleEmployment.be  This test is not yet approved or cleared by the [place] FDA and has been authorized for detection and/or diagnosis of SARS-CoV-2 by FDA under an Emergency Use Authorization (EUA). This EUA will remain in effect (meaning this test can be used) for the duration of the COVID-19 declaration under Section 564(b)(1) of the Act, 21 U.S.C. section 360bbb-3(b)(1), unless the authorization is terminated or revoked.  Performed at [facility], [address], [place], [place] [address]          Radiology Studies: No results found.      Scheduled Meds:  vitamin C  250 mg Oral Daily   calcium-vitamin D  1 tablet Oral Q breakfast   donepezil  10 mg Oral QHS   enoxaparin (LOVENOX) injection  30 mg Subcutaneous Q24H   feeding supplement  237 mL Oral TID BM   insulin aspart  0-5 Units Subcutaneous QHS   insulin aspart  0-9 Units Subcutaneous TID WC   megestrol  200 mg Oral BID   multivitamin with minerals  1 tablet Oral Daily   pantoprazole  80 mg Oral Daily   rosuvastatin  5 mg Oral Daily   vitamin B-12  1,000 mcg Oral Daily   zinc sulfate  220 mg Oral Daily   Continuous Infusions:     LOS: 4 days    Time spent: 38 minutes spent on chart review, discussion with nursing staff, consultants, updating family and interview/physical exam; more than 50% of that time was spent in counseling and/or coordination of care.    Pam Boone J [place] DO [facility] Available  via Epic secure chat 7am-7pm After these hours, please refer to coverage provider listed on [email or web address] 06/17/2022, 10:53 AM

## 2022-06-18 DIAGNOSIS — U071 COVID-19: Secondary | ICD-10-CM | POA: Diagnosis not present

## 2022-06-18 LAB — GLUCOSE, CAPILLARY
Glucose-Capillary: 111 mg/dL — ABNORMAL HIGH (ref 70–99)
Glucose-Capillary: 185 mg/dL — ABNORMAL HIGH (ref 70–99)
Glucose-Capillary: 190 mg/dL — ABNORMAL HIGH (ref 70–99)
Glucose-Capillary: 184 mg/dL — ABNORMAL HIGH (ref 70–99)

## 2022-06-18 NOTE — TOC Progression Note (Signed)
Transition of Care [facility]) - Progression Note    Patient Details  Name: Pam Boone MRN: [id number] Date of Birth: 1933-01-09  Transition of Care [facility]) CM/SW [place], LCSW Phone Number: 06/18/2022, 10:19 AM  Clinical Narrative:    Received call from [facility] regarding long term care placement at their facility. She stated that family had informed her the plan was for pt to discharge to their facility directly following her hospitalization. CSW informed her that pt is recommended for SNF placement prior to transitioning to LTC. Nanine Means will need to evaluate this pt as well however, due to pt's COVID + status she is unsure if their nurse will be comfortable meeting with pt until she is quarantined. CSW faxed records to [facility] for review.    Expected Discharge Plan: Skilled Nursing Facility Barriers to Discharge: SNF Covid  Expected Discharge Plan and Services Expected Discharge Plan: [place] In-house Referral: Clinical Social Work, Hospice / Palliative Care Discharge Planning Services: CM Consult Post Acute Care Choice: [place] Living arrangements for the past 2 months: Single Family Home                 DME Arranged: N/A DME Agency: NA                   Social Determinants of Health (SDOH) Interventions    Readmission Risk Interventions     No data to display

## 2022-06-18 NOTE — Plan of Care (Signed)
  Problem: Education: Goal: Knowledge of General Education information will improve Description: Including pain rating scale, medication(s)/side effects and non-pharmacologic comfort measures Outcome: Progressing   Problem: Health Behavior/Discharge Planning: Goal: Ability to manage health-related needs will improve Outcome: Progressing   Problem: Clinical Measurements: Goal: Ability to maintain clinical measurements within normal limits will improve Outcome: Progressing Goal: Will remain free from infection Outcome: Progressing Goal: Diagnostic test results will improve Outcome: Progressing Goal: Respiratory complications will improve Outcome: Progressing Goal: Cardiovascular complication will be avoided Outcome: Progressing   Problem: Activity: Goal: Risk for activity intolerance will decrease Outcome: Progressing   Problem: Nutrition: Goal: Adequate nutrition will be maintained Outcome: Progressing   Problem: Elimination: Goal: Will not experience complications related to bowel motility Outcome: Progressing Goal: Will not experience complications related to urinary retention Outcome: Progressing   Problem: Safety: Goal: Ability to remain free from injury will improve Outcome: Progressing   Problem: Skin Integrity: Goal: Risk for impaired skin integrity will decrease Outcome: Progressing   Problem: Education: Goal: Ability to describe self-care measures that may prevent or decrease complications (Diabetes Survival Skills Education) will improve Outcome: Progressing Goal: Individualized Educational Video(s) Outcome: Progressing   Problem: Coping: Goal: Ability to adjust to condition or change in health will improve Outcome: Progressing   Problem: Fluid Volume: Goal: Ability to maintain a balanced intake and output will improve Outcome: Progressing   Problem: Health Behavior/Discharge Planning: Goal: Ability to identify and utilize available resources and  services will improve Outcome: Progressing Goal: Ability to manage health-related needs will improve Outcome: Progressing   Problem: Metabolic: Goal: Ability to maintain appropriate glucose levels will improve Outcome: Progressing   Problem: Nutritional: Goal: Maintenance of adequate nutrition will improve Outcome: Progressing Goal: Progress toward achieving an optimal weight will improve Outcome: Progressing   Problem: Skin Integrity: Goal: Risk for impaired skin integrity will decrease Outcome: Progressing   Problem: Tissue Perfusion: Goal: Adequacy of tissue perfusion will improve Outcome: Progressing   Problem: Education: Goal: Knowledge of risk factors and measures for prevention of condition will improve Outcome: Progressing   Problem: Coping: Goal: Psychosocial and spiritual needs will be supported Outcome: Progressing   Problem: Respiratory: Goal: Will maintain a patent airway Outcome: Progressing Goal: Complications related to the disease process, condition or treatment will be avoided or minimized Outcome: Progressing

## 2022-06-18 NOTE — Care Management Important Message (Signed)
Important Message  Patient Details IM Letter placed in Patients room. Name: Pam Boone MRN: [id number] Date of Birth: 09-29-1933   Medicare Important Message Given:  Yes     Kerin Salen 06/18/2022, 11:11 AM

## 2022-06-18 NOTE — Progress Notes (Signed)
PROGRESS NOTE    Pam Boone  DPO:[id number] DOB: [birth year] DOA: 06/11/2022 PCP: Cathlean Sauer, MD    Brief Narrative:   Pam Boone is a 88 y.o. female with past medical history significant for dementia, essential hypertension, HLD, moderate protein calorie malnutrition, vertigo, throat cancer s/p radiation who initially presented to [facility] from home due to confusion, poor oral intake and intermittent fevers.  Patient unable to participate in HPI due to her underlying dementia and confusion.  Family reports that she lives alone but they check on her occasionally.  They were planning on moving her to an assisted living facility in the near future.  They noted that she has not been eating or drinking well over the last 2 weeks, was prior utilizing boost shakes.  In the ED, temperature 101.8 F, HR 71, RR 13, BP 105/90, SPO2 95% on room air.  WBC 8.3, hemoglobin 14.0, platelets 184.  Sodium 132, potassium 3.9, chloride 102, CO2 21, glucose 258, BUN 33, creatinine 0.86.  Lipase 49, AST 27, ALT 17, total bilirubin 0.9. High sensitive troponin 7>8.  Lactic acid 2.6.  Urinalysis with negative leukocytes, negative nitrite, rare bacteria, no WBCs.  COVID-19 PCR positive.  Influenza A/B PCR negative.  Chest x-ray with no acute cardiopulmonary disease process, noted chronic lung disease with hyperinflation and scarring.  Blood and urine cultures obtained.  Patient was started on broad-spectrum antibiotics with cefepime and vancomycin.  TRH was consulted for admission given fever, COVID-19 infection; and patient was transferred to [facility].  Assessment & Plan:   Covid-19 viral infection Patient presenting to ED with intermittent fevers, poor oral intake over the last 1-2 weeks per family report. COVID test: + 06/11/2022.  CRP 8.0, D-dimer 1.76.  Chest x-ray with no active cardiopulmonary disease process, no oxygen requirement.  Family declining antiviral treatment at this time. --Continue  supportive care with duonebs PRN, vitamin C, zinc, Tylenol, antitussives  --Continue airborne/contact isolation precautions for 10 days from the day of diagnosis  Hyponatremia Sodium 132 on admission, likely secondary to hypovolemic hyponatremia in the setting of dehydration.  Continue to encourage increased oral intake  Hypokalemia Repleted during hospitalization  Lactic acidosis: Resolved Lactic acid 2.6 on admission.  Etiology likely secondary to dehydration. --LA 2.6>1.2  Dysphagia --SLP following, appreciate assistance.  Underwent modified barium swallow on 06/13/2022. -- Dysphagia 3/mechanical soft diet, nectar thickened liquids, medications crushed with pure, full supervision with meals --Aspiration precautions  Sepsis, ruled out Patient was initially started on empiric antibiotics with IV vancomycin and cefepime.  Lactic acid elevated likely secondary to dehydration.  Fevers likely secondary to COVID-19 viral infection as above.  No leukocytosis.  Chest x-ray with no active cardiopulmonary disease process, urinalysis unrevealing.  Will discontinue antibiotics for now and monitor fever curve.  Essential hypertension Home medications include metoprolol succinate 12.5 mg p.o. daily, losartan 100 mg p.o. daily, amlodipine 5 mg p.o. daily. --Hold antihypertensives for now; anticipate will discontinue antihypertensives on discharge --Monitor BP, will start if required  GERD: Continue PPI  HLD: Crestor 5 mg p.o. daily  Dementia --Delirium precautions --Get up during the day --Encourage a familiar face to remain present throughout the day --Keep blinds open and lights on during daylight hours --Minimize the use of opioids/benzodiazepines --Donepezil 10 mg p.o. nightly --Melatonin 5 mg p.o. nightly as needed  Moderate protein calorie malnutrition Adult failure to thrive Patient with significant fat loss/muscle depletion on physical exam. Body mass index is 15.48  kg/m. Nutrition Status: Nutrition  Problem: Inadequate oral intake Etiology: chronic illness, dysphagia (dementia) Signs/Symptoms:  (liquid diet, poor PO intakes reported) Interventions: Boost Plus, MVI, Hormel Shake --Megace 200 mg BID --Dietitian following, supplementation  Weakness/deconditioning: Was seen by PT and OT with recommendations of SNF placement.  Initially planned for SNF but family now wants patient to go directly to [facility] ALF to reduce the number of moves the patient has to experience.  Per family, [facility] currently has a bed available and currently waiting to complete quarantine for COVID-19 viral infection to end on 06/21/2022.  DVT prophylaxis: enoxaparin (LOVENOX) injection 30 mg Start: 06/12/22 2200    Code Status: DNR Family Communication: No family present at bedside this morning, daughter and son-in-law updated extensively at bedside yesterday  Disposition Plan:  Level of care: Med-Surg Status is: Inpatient Remains inpatient appropriate because: Medically stable for discharge to [facility]F following completion of 10-day quarantine      Consultants:  Palliative care:   Procedures:  MBS:  Antimicrobials:  Vancomycin 7/4 - 7/5 Cefepime 7/4 - 7/5   Subjective: Patient seen examined at bedside, resting comfortably.  Lying in bed.  Remains pleasantly confused.  No complaints this morning.  No family present; updated daughter and son-in-law extensively at bedside yesterday denies chest pain, no shortness of breath, no abdominal pain, no musculoskeletal pain.  No acute events overnight per nursing staff.  Objective: Vitals:   06/17/22 0614 06/17/22 1420 06/17/22 2055 06/18/22 0440  BP: 118/65 129/67 120/63 108/63  Pulse: 65 68 82 70  Resp: '16 14 19 18  '$ Temp: 97.8 F (36.6 C) 98.2 F (36.8 C) 97.9 F (36.6 C) 98.1 F (36.7 C)  TempSrc: Oral Oral Oral Oral  SpO2: 97% 98% 98% 96%  Weight:   38.4 kg   Height:   '5\' 2"'$  (1.575 m)      Intake/Output Summary (Last 24 hours) at 06/18/2022 1219 Last data filed at 06/17/2022 2057 Gross per 24 hour  Intake 652 ml  Output 375 ml  Net 277 ml   Filed Weights   06/11/22 1558 06/11/22 1622 06/17/22 2055  Weight: 36.7 kg 37.7 kg 38.4 kg    Examination:  Physical Exam: GEN: NAD, alert, pleasantly confused, thin/cachectic in appearance HEENT: NCAT, PERRL, EOMI, sclera clear, MMM PULM: CTAB w/o wheezes/crackles, normal respiratory effort, on room air CV: RRR w/o M/G/R GI: abd soft, NTND, NABS, no R/G/M MSK: no peripheral edema, moves all extremities independently NEURO: CN II-XII intact, no focal deficits PSYCH: Depressed mood, flat affect Integumentary: dry/intact, no rashes or wounds    Data Reviewed: I have personally reviewed following labs and imaging studies  CBC: Recent Labs  Lab 06/11/22 1613 06/12/22 2030 06/13/22 0520  WBC 8.3 5.5 6.1  NEUTROABS  --   --  4.9  HGB 14.0 13.0 13.4  HCT 40.5 37.3 38.0  MCV 93.8 93.0 92.7  PLT 184 149* 419*   Basic Metabolic Panel: Recent Labs  Lab 06/11/22 1613 06/12/22 2030 06/13/22 0520 06/14/22 0609 06/15/22 0749 06/16/22 0715  NA 132*  --  133* 132* 129* 130*  K 3.9  --  2.9* 3.0* 3.3* 4.0  CL 102  --  99 101 100 101  CO2 21*  --  '24 25 22 22  '$ GLUCOSE 258*  --  82 92 91 84  BUN 33*  --  '12 14 10 16  '$ CREATININE 0.86 0.43* 0.44 0.41* 0.35* 0.57  CALCIUM 8.6*  --  8.3* 7.5* 7.6* 8.2*  MG  --   --  1.8 2.1 2.0  --   PHOS  --   --  2.8  --   --   --    GFR: Estimated Creatinine Clearance: 30.6 mL/min (by C-G formula based on SCr of 0.57 mg/dL). Liver Function Tests: Recent Labs  Lab 06/11/22 1613 06/13/22 0520  AST 27 19  ALT 17 15  ALKPHOS 72 54  BILITOT 0.9 0.9  PROT 7.6 6.2*  ALBUMIN 4.0 3.3*   Recent Labs  Lab 06/11/22 1613  LIPASE 49   No results for input(s): "AMMONIA" in the last 168 hours. Coagulation Profile: Recent Labs  Lab 06/12/22 2030  INR 1.1   Cardiac Enzymes: No  results for input(s): "CKTOTAL", "CKMB", "CKMBINDEX", "TROPONINI" in the last 168 hours. BNP (last 3 results) No results for input(s): "PROBNP" in the last 8760 hours. HbA1C: No results for input(s): "HGBA1C" in the last 72 hours.  CBG: Recent Labs  Lab 06/17/22 1128 06/17/22 1703 06/17/22 2137 06/18/22 0730 06/18/22 1133  GLUCAP 146* 198* 157* 111* 185*   Lipid Profile: No results for input(s): "CHOL", "HDL", "LDLCALC", "TRIG", "CHOLHDL", "LDLDIRECT" in the last 72 hours. Thyroid Function Tests: No results for input(s): "TSH", "T4TOTAL", "FREET4", "T3FREE", "THYROIDAB" in the last 72 hours. Anemia Panel: No results for input(s): "VITAMINB12", "FOLATE", "FERRITIN", "TIBC", "IRON", "RETICCTPCT" in the last 72 hours.  Sepsis Labs: Recent Labs  Lab 06/11/22 1617 06/11/22 1800 06/13/22 0520  PROCALCITON  --   --  0.21  LATICACIDVEN 2.6* 2.4* 1.2    Recent Results (from the past 240 hour(s))  Culture, blood (Routine x 2)     Status: None   Collection Time: 06/11/22  4:00 PM   Specimen: BLOOD  Result Value Ref Range Status   Specimen Description   Final    BLOOD LEFT ANTECUBITAL Performed at [facility], [address] [place], [place] [address]    Special Requests   Final    BOTTLES DRAWN AEROBIC AND ANAEROBIC Blood Culture adequate volume Performed at [facility], [address], [place], [place] [address]    Culture   Final    NO GROWTH 5 DAYS Performed at [facility], [place] [address], [place], [place] [address]    Report Status 06/16/2022 FINAL  Final  Culture, blood (Routine x 2)     Status: None   Collection Time: 06/11/22  4:05 PM   Specimen: BLOOD  Result Value Ref Range Status   Specimen Description   Final    BLOOD RIGHT ANTECUBITAL Performed at [facility], [address] [place], [place] [address]    Special Requests   Final    BOTTLES DRAWN AEROBIC AND ANAEROBIC Blood Culture adequate volume Performed at  [facility], [address] [place], [place] [address]    Culture   Final    NO GROWTH 5 DAYS Performed at [facility], [place] [address] [place], [place] [address]    Report Status 06/16/2022 FINAL  Final  Urine Culture     Status: None   Collection Time: 06/11/22  4:47 PM   Specimen: Urine, Clean Catch  Result Value Ref Range Status   Specimen Description   Final    URINE, CLEAN CATCH Performed at [facility], [address] [place],  [address]    Special Requests   Final    NONE Performed at [facility], [address] [place], [place] [address]    Culture   Final    NO GROWTH Performed at [facility], [place] [address] [place], [place] [address]    Report Status 06/13/2022 FINAL  Final  Resp Panel by RT-PCR (Flu A&B, Covid) Anterior Nasal Swab     Status: Abnormal   Collection Time: 06/11/22  5:02 PM   Specimen: Anterior Nasal Swab  Result Value Ref Range Status   SARS Coronavirus 2 by RT PCR POSITIVE (A) NEGATIVE Final    Comment: (NOTE) SARS-CoV-2 target nucleic acids are DETECTED.  The SARS-CoV-2 RNA is generally detectable in upper respiratory specimens during the acute phase of infection. Positive results are indicative of the presence of the identified virus, but do not rule out bacterial infection or co-infection with other pathogens not detected by the test. Clinical correlation with patient history and other diagnostic information is necessary to determine patient infection status. The expected result is Negative.  Fact Sheet for Patients: EntrepreneurPulse.com.au  Fact Sheet for Healthcare Providers: IncredibleEmployment.be  This test is not yet approved or cleared by the [place] FDA and  has been authorized for detection and/or diagnosis of SARS-CoV-2 by FDA under an Emergency Use Authorization (EUA).  This EUA will remain in effect (meaning this test can be  used) for the duration of  the COVID-19 declaration under Section 564(b)(1) of the A ct, 21 U.S.C. section 360bbb-3(b)(1), unless the authorization is terminated or revoked sooner.     Influenza A by PCR NEGATIVE NEGATIVE Final   Influenza B by PCR NEGATIVE NEGATIVE Final    Comment: (NOTE) The Xpert Xpress SARS-CoV-2/FLU/RSV plus assay is intended as an aid in the diagnosis of influenza from Nasopharyngeal swab specimens and should not be used as a sole basis for treatment. Nasal washings and aspirates are unacceptable for Xpert Xpress SARS-CoV-2/FLU/RSV testing.  Fact Sheet for Patients: EntrepreneurPulse.com.au  Fact Sheet for Healthcare Providers: IncredibleEmployment.be  This test is not yet approved or cleared by the [place] FDA and has been authorized for detection and/or diagnosis of SARS-CoV-2 by FDA under an Emergency Use Authorization (EUA). This EUA will remain in effect (meaning this test can be used) for the duration of the COVID-19 declaration under Section 564(b)(1) of the Act, 21 U.S.C. section 360bbb-3(b)(1), unless the authorization is terminated or revoked.  Performed at [facility], [address], [place], [place] [address]          Radiology Studies: No results found.      Scheduled Meds:  vitamin C  250 mg Oral Daily   calcium-vitamin D  1 tablet Oral Q breakfast   donepezil  10 mg Oral QHS   enoxaparin (LOVENOX) injection  30 mg Subcutaneous Q24H   feeding supplement  237 mL Oral TID BM   insulin aspart  0-5 Units Subcutaneous QHS   insulin aspart  0-9 Units Subcutaneous TID WC   megestrol  200 mg Oral BID   multivitamin with minerals  1 tablet Oral Daily   pantoprazole  80 mg Oral Daily   rosuvastatin  5 mg Oral Daily   vitamin B-12  1,000 mcg Oral Daily   zinc sulfate  220 mg Oral Daily   Continuous Infusions:     LOS: 5 days    Time spent: 38 minutes spent on chart  review, discussion with nursing staff, consultants, updating family and interview/physical exam; more than 50% of that time was spent in counseling and/or coordination of care.  Ladye Macnaughton J [place] DO [facility] Available via Epic secure chat 7am-7pm After these hours, please refer to coverage provider listed on [email or web address] 06/18/2022, 12:19 PM

## 2022-06-18 NOTE — Progress Notes (Signed)
Physical Therapy Treatment Patient Details Name: Pam Boone MRN: [id number] DOB: [birth year] Today's Date: 06/18/2022   History of Present Illness Pam Boone is a 88 y.o. female with past medical history significant for dementia, essential hypertension, HLD, moderate protein calorie malnutrition, vertigo, throat cancer s/p radiation who initially presented to [facility] ED from home due to confusion, poor oral intake and intermittent fevers.  Family reports that she lives alone but they check on her occasionally throughout the week. They were planning on moving her to an assisted living facility [facility]) in the near future.  They noted that she has not been eating or drinking well over the last 2 weeks, was prior utilizing boost shakes. Found to be COVID positive and admitted.    PT Comments    Pt reporting increased fatigue today, more concerned about ability to mobilize but agreeable to be seen. Pt required mid assist for bed mobility and transfers including step pivot transfer to recliner. Pt declined to ambulate further secondary to fatigue and experiencing coughing fit, RN notified. Discharge destination remains appropriate. We will continue to follow acutely.   Recommendations for follow up therapy are one component of a multi-disciplinary discharge planning process, led by the attending physician.  Recommendations may be updated based on patient status, additional functional criteria and insurance authorization.  Follow Up Recommendations  Skilled nursing-short term rehab (<3 hours/day) Can patient physically be transported by private vehicle: No   Assistance Recommended at Discharge Frequent or constant Supervision/Assistance  Patient can return home with the following A lot of help with walking and/or transfers;A lot of help with bathing/dressing/bathroom;Assistance with cooking/housework;Assist for transportation;Help with stairs or ramp for entrance   Equipment Recommendations  None  recommended by PT (TBD at SNF)    Recommendations for Other Services       Precautions / Restrictions Precautions Precautions: Fall Precaution Comments: daughter reports she's had 1 fall in the past 6 months, it occured while going up steps to enter her home Restrictions Weight Bearing Restrictions: No     Mobility  Bed Mobility Overal bed mobility: Needs Assistance Bed Mobility: Supine to Sit     Supine to sit: Mod assist     General bed mobility comments: assist to raise trunk via HHA, scoot hips EOB via pad    Transfers Overall transfer level: Needs assistance Equipment used: Rolling walker (2 wheels) Transfers: Sit to/from Stand, Bed to chair/wheelchair/BSC Sit to Stand: Min assist   Step pivot transfers: Min assist       General transfer comment: Pt required min assist for lift assist and balance (demonstrated posterior lean), completed step pivot transfer with min assist for steadying, no overt LOB noted. Further mobility deferred secondary to fatigue and pt experiencing coughing fit.    Ambulation/Gait                   Stairs             Wheelchair Mobility    Modified Rankin (Stroke Patients Only)       Balance Overall balance assessment: Needs assistance Sitting-balance support: No upper extremity supported, Feet supported Sitting balance-Leahy Scale: Fair     Standing balance support: During functional activity, Reliant on assistive device for balance Standing balance-Leahy Scale: Poor                              Cognition Arousal/Alertness: Awake/alert Behavior During Therapy: WFL for tasks assessed/performed Overall  Cognitive Status: History of cognitive impairments - at baseline                                 General Comments: Able to follow commands        Exercises      General Comments        Pertinent Vitals/Pain Pain Assessment Breathing: normal Negative Vocalization:  none Facial Expression: smiling or inexpressive Body Language: relaxed Consolability: no need to console PAINAD Score: 0    Home Living                          Prior Function            PT Goals (current goals can now be found in the care plan section) Acute Rehab PT Goals Patient Stated Goal: SNF per daughter PT Goal Formulation: With patient Time For Goal Achievement: 06/28/22 Potential to Achieve Goals: Fair Progress towards PT goals: Progressing toward goals    Frequency    Min 2X/week      PT Plan Current plan remains appropriate    Co-evaluation              AM-PAC PT "6 Clicks" Mobility   Outcome Measure  Help needed turning from your back to your side while in a flat bed without using bedrails?: A Little Help needed moving from lying on your back to sitting on the side of a flat bed without using bedrails?: A Lot Help needed moving to and from a bed to a chair (including a wheelchair)?: A Lot Help needed standing up from a chair using your arms (e.g., wheelchair or bedside chair)?: A Lot Help needed to walk in hospital room?: Total Help needed climbing 3-5 steps with a railing? : Total 6 Click Score: 11    End of Session Equipment Utilized During Treatment: Gait belt Activity Tolerance: Patient limited by fatigue Patient left: in chair;with call bell/phone within reach;with chair alarm set Nurse Communication: Mobility status PT Visit Diagnosis: Difficulty in walking, not elsewhere classified (R26.2);Other abnormalities of gait and mobility (R26.89)     Time: 1010-1025 PT Time Calculation (min) (ACUTE ONLY): 15 min  Charges:  $Therapeutic Activity: 8-22 mins                     Coolidge Breeze, PT, DPT [place] [facility] Department Office: [phone] Pager: [phone]   Coolidge Breeze 06/18/2022, 12:02 PM

## 2022-06-19 DIAGNOSIS — U071 COVID-19: Secondary | ICD-10-CM | POA: Diagnosis not present

## 2022-06-19 LAB — GLUCOSE, CAPILLARY
Glucose-Capillary: 94 mg/dL (ref 70–99)
Glucose-Capillary: 164 mg/dL — ABNORMAL HIGH (ref 70–99)
Glucose-Capillary: 138 mg/dL — ABNORMAL HIGH (ref 70–99)
Glucose-Capillary: 142 mg/dL — ABNORMAL HIGH (ref 70–99)

## 2022-06-19 LAB — CREATININE, SERUM
Creatinine, Ser: 0.63 mg/dL (ref 0.44–1.00)
GFR, Estimated: >60 mL/min (ref >60)

## 2022-06-19 NOTE — Progress Notes (Signed)
PROGRESS NOTE    Madisen Ludvigsen  FXT:[id number] DOB: Jan 01, 1933 DOA: 06/11/2022 PCP: Cathlean Sauer, MD    Brief Narrative:   Sabriel Borromeo is a 88 y.o. female with past medical history significant for dementia, essential hypertension, HLD, moderate protein calorie malnutrition, vertigo, throat cancer s/p radiation who initially presented to [facility] ED from home due to confusion, poor oral intake and intermittent fevers.  Patient unable to participate in HPI due to her underlying dementia and confusion.  Family reports that she lives alone but they check on her occasionally.  They were planning on moving her to an assisted living facility in the near future.  They noted that she has not been eating or drinking well over the last 2 weeks, was prior utilizing boost shakes.  In the ED, temperature 101.8 F, HR 71, RR 13, BP 105/90, SPO2 95% on room air.  WBC 8.3, hemoglobin 14.0, platelets 184.  Sodium 132, potassium 3.9, chloride 102, CO2 21, glucose 258, BUN 33, creatinine 0.86.  Lipase 49, AST 27, ALT 17, total bilirubin 0.9. High sensitive troponin 7>8.  Lactic acid 2.6.  Urinalysis with negative leukocytes, negative nitrite, rare bacteria, no WBCs.  COVID-19 PCR positive.  Influenza A/B PCR negative.  Chest x-ray with no acute cardiopulmonary disease process, noted chronic lung disease with hyperinflation and scarring.  Blood and urine cultures obtained.  Patient was started on broad-spectrum antibiotics with cefepime and vancomycin.  TRH was consulted for admission given fever, COVID-19 infection; and patient was transferred to [facility].  Assessment & Plan:   Covid-19 viral infection Patient presenting to ED with intermittent fevers, poor oral intake over the last 1-2 weeks per family report. COVID test: + 06/11/2022.  CRP 8.0, D-dimer 1.76.  Chest x-ray with no active cardiopulmonary disease process, no oxygen requirement.  Family declining antiviral treatment at this time. --Continue  supportive care with duonebs PRN, vitamin C, zinc, Tylenol, antitussives  --Continue airborne/contact isolation precautions for 10 days from the day of diagnosis  Hyponatremia Sodium 132 on admission, likely secondary to hypovolemic hyponatremia in the setting of dehydration.  Continue to encourage increased oral intake  Hypokalemia Repleted during hospitalization  Lactic acidosis: Resolved Lactic acid 2.6 on admission.  Etiology likely secondary to dehydration. --LA 2.6>1.2  Dysphagia --SLP following, appreciate assistance.  Underwent modified barium swallow on 06/13/2022. -- Dysphagia 3/mechanical soft diet, nectar thickened liquids, medications crushed with pure, full supervision with meals --Aspiration precautions  Sepsis, ruled out Patient was initially started on empiric antibiotics with IV vancomycin and cefepime.  Lactic acid elevated likely secondary to dehydration.  Fevers likely secondary to COVID-19 viral infection as above.  No leukocytosis.  Chest x-ray with no active cardiopulmonary disease process, urinalysis unrevealing.  Will discontinue antibiotics for now and monitor fever curve.  Essential hypertension Home medications include metoprolol succinate 12.5 mg p.o. daily, losartan 100 mg p.o. daily, amlodipine 5 mg p.o. daily. --Hold antihypertensives for now; anticipate will discontinue antihypertensives on discharge --Monitor BP, will start if required  GERD: Continue PPI  HLD: Crestor 5 mg p.o. daily  Dementia --Delirium precautions --Get up during the day --Encourage a familiar face to remain present throughout the day --Keep blinds open and lights on during daylight hours --Minimize the use of opioids/benzodiazepines --Donepezil 10 mg p.o. nightly --Melatonin 5 mg p.o. nightly as needed  Moderate protein calorie malnutrition Adult failure to thrive Patient with significant fat loss/muscle depletion on physical exam. Body mass index is 15.48  kg/m. Nutrition Status: Nutrition  Problem: Inadequate oral intake Etiology: chronic illness, dysphagia (dementia) Signs/Symptoms:  (liquid diet, poor PO intakes reported) Interventions: Boost Plus, MVI, Hormel Shake --Megace 200 mg BID --Dietitian following, supplementation  Weakness/deconditioning: Was seen by PT and OT with recommendations of SNF placement.  Initially planned for SNF but family now wants patient to go directly to [facility]F to reduce the number of moves the patient has to experience.  Per family, [facility] a bed available and currently waiting to complete quarantine for COVID-19 viral infection to end on 06/21/2022.  DVT prophylaxis: enoxaparin (LOVENOX) injection 30 mg Start: 06/12/22 2200    Code Status: DNR Family Communication: Updated patient's daughter and son-in-law present at bedside this morning  Disposition Plan:  Level of care: Med-Surg Status is: Inpatient Remains inpatient appropriate because: Medically stable for discharge to [facility]F following completion of 10-day quarantine      Consultants:  Palliative care:   Procedures:  MBS:  Antimicrobials:  Vancomycin 7/4 - 7/5 Cefepime 7/4 - 7/5   Subjective: Patient seen examined at bedside, resting comfortably.  Lying in bed.  Remains pleasantly confused.  No complaints this morning.  Daughter and son-in-law present.  Patient denies chest pain, no shortness of breath, no abdominal pain, no musculoskeletal pain.  No acute events overnight per nursing staff.  Objective: Vitals:   06/18/22 1329 06/18/22 2140 06/19/22 0600 06/19/22 1301  BP: 111/68 130/72 (!) 141/73 135/79  Pulse: 77 89 77 95  Resp: 16 16 (!) 23 (!) 22  Temp: 98.3 F (36.8 C) 98.6 F (37 C) 97.9 F (36.6 C) 97.6 F (36.4 C)  TempSrc: Oral Oral Oral Oral  SpO2: 97% 97% 98% 98%  Weight:      Height:        Intake/Output Summary (Last 24 hours) at 06/19/2022 1331 Last data filed at 06/19/2022 0900 Gross  per 24 hour  Intake 616 ml  Output --  Net 616 ml   Filed Weights   06/11/22 1558 06/11/22 1622 06/17/22 2055  Weight: 36.7 kg 37.7 kg 38.4 kg    Examination:  Physical Exam: GEN: NAD, alert, pleasantly confused, thin/cachectic in appearance HEENT: NCAT, PERRL, EOMI, sclera clear, MMM PULM: CTAB w/o wheezes/crackles, normal respiratory effort, on room air CV: RRR w/o M/G/R GI: abd soft, NTND, NABS, no R/G/M MSK: no peripheral edema, moves all extremities independently NEURO: CN II-XII intact, no focal deficits PSYCH: Depressed mood, flat affect Integumentary: dry/intact, no rashes or wounds    Data Reviewed: I have personally reviewed following labs and imaging studies  CBC: Recent Labs  Lab 06/12/22 2030 06/13/22 0520  WBC 5.5 6.1  NEUTROABS  --  4.9  HGB 13.0 13.4  HCT 37.3 38.0  MCV 93.0 92.7  PLT 149* 035*   Basic Metabolic Panel: Recent Labs  Lab 06/13/22 0520 06/14/22 0609 06/15/22 0749 06/16/22 0715 06/19/22 0531  NA 133* 132* 129* 130*  --   K 2.9* 3.0* 3.3* 4.0  --   CL 99 101 100 101  --   CO2 '24 25 22 22  '$ --   GLUCOSE 82 92 91 84  --   BUN '12 14 10 16  '$ --   CREATININE 0.44 0.41* 0.35* 0.57 0.63  CALCIUM 8.3* 7.5* 7.6* 8.2*  --   MG 1.8 2.1 2.0  --   --   PHOS 2.8  --   --   --   --    GFR: Estimated Creatinine Clearance: 30.6 mL/min (by C-G formula based  on SCr of 0.63 mg/dL). Liver Function Tests: Recent Labs  Lab 06/13/22 0520  AST 19  ALT 15  ALKPHOS 54  BILITOT 0.9  PROT 6.2*  ALBUMIN 3.3*   No results for input(s): "LIPASE", "AMYLASE" in the last 168 hours.  No results for input(s): "AMMONIA" in the last 168 hours. Coagulation Profile: Recent Labs  Lab 06/12/22 2030  INR 1.1   Cardiac Enzymes: No results for input(s): "CKTOTAL", "CKMB", "CKMBINDEX", "TROPONINI" in the last 168 hours. BNP (last 3 results) No results for input(s): "PROBNP" in the last 8760 hours. HbA1C: No results for input(s): "HGBA1C" in the last 72  hours.  CBG: Recent Labs  Lab 06/18/22 1133 06/18/22 1637 06/18/22 2230 06/19/22 0802 06/19/22 1139  GLUCAP 185* 190* 184* 94 164*   Lipid Profile: No results for input(s): "CHOL", "HDL", "LDLCALC", "TRIG", "CHOLHDL", "LDLDIRECT" in the last 72 hours. Thyroid Function Tests: No results for input(s): "TSH", "T4TOTAL", "FREET4", "T3FREE", "THYROIDAB" in the last 72 hours. Anemia Panel: No results for input(s): "VITAMINB12", "FOLATE", "FERRITIN", "TIBC", "IRON", "RETICCTPCT" in the last 72 hours.  Sepsis Labs: Recent Labs  Lab 06/13/22 0520  PROCALCITON 0.21  LATICACIDVEN 1.2    Recent Results (from the past 240 hour(s))  Culture, blood (Routine x 2)     Status: None   Collection Time: 06/11/22  4:00 PM   Specimen: BLOOD  Result Value Ref Range Status   Specimen Description   Final    BLOOD LEFT ANTECUBITAL Performed at [facility], [address] [place], [place] [address]    Special Requests   Final    BOTTLES DRAWN AEROBIC AND ANAEROBIC Blood Culture adequate volume Performed at [facility], [address], [place], [place] [address]    Culture   Final    NO GROWTH 5 DAYS Performed at [facility], [place] [address] [place], [place] [address]    Report Status 06/16/2022 FINAL  Final  Culture, blood (Routine x 2)     Status: None   Collection Time: 06/11/22  4:05 PM   Specimen: BLOOD  Result Value Ref Range Status   Specimen Description   Final    BLOOD RIGHT ANTECUBITAL Performed at [facility], [address] [place], [place] [address]    Special Requests   Final    BOTTLES DRAWN AEROBIC AND ANAEROBIC Blood Culture adequate volume Performed at [facility], [address] [place], [place] [address]    Culture   Final    NO GROWTH 5 DAYS Performed at [facility], [place] [address] [place], [place] [address]    Report Status 06/16/2022 FINAL  Final  Urine Culture     Status: None   Collection  Time: 06/11/22  4:47 PM   Specimen: Urine, Clean Catch  Result Value Ref Range Status   Specimen Description   Final    URINE, CLEAN CATCH Performed at [facility], [address] [place], [place] [address]    Special Requests   Final    NONE Performed at [facility], [address] [place], [place] [address]    Culture   Final    NO GROWTH Performed at [facility], [place] [address], [place], [place] [address]    Report Status 06/13/2022 FINAL  Final  Resp Panel by RT-PCR (Flu A&B, Covid) Anterior Nasal Swab     Status: Abnormal  Collection Time: 06/11/22  5:02 PM   Specimen: Anterior Nasal Swab  Result Value Ref Range Status   SARS Coronavirus 2 by RT PCR POSITIVE (A) NEGATIVE Final    Comment: (NOTE) SARS-CoV-2 target nucleic acids are DETECTED.  The SARS-CoV-2 RNA is generally detectable in upper respiratory specimens during the acute phase of infection. Positive results are indicative of the presence of the identified virus, but do not rule out bacterial infection or co-infection with other pathogens not detected by the test. Clinical correlation with patient history and other diagnostic information is necessary to determine patient infection status. The expected result is Negative.  Fact Sheet for Patients: EntrepreneurPulse.com.au  Fact Sheet for Healthcare Providers: IncredibleEmployment.be  This test is not yet approved or cleared by the [place] FDA and  has been authorized for detection and/or diagnosis of SARS-CoV-2 by FDA under an Emergency Use Authorization (EUA).  This EUA will remain in effect (meaning this test can be used) for the duration of  the COVID-19 declaration under Section 564(b)(1) of the A ct, 21 U.S.C. section 360bbb-3(b)(1), unless the authorization is terminated or revoked sooner.     Influenza A by PCR NEGATIVE NEGATIVE Final   Influenza B by PCR NEGATIVE NEGATIVE  Final    Comment: (NOTE) The Xpert Xpress SARS-CoV-2/FLU/RSV plus assay is intended as an aid in the diagnosis of influenza from Nasopharyngeal swab specimens and should not be used as a sole basis for treatment. Nasal washings and aspirates are unacceptable for Xpert Xpress SARS-CoV-2/FLU/RSV testing.  Fact Sheet for Patients: EntrepreneurPulse.com.au  Fact Sheet for Healthcare Providers: IncredibleEmployment.be  This test is not yet approved or cleared by the [place] FDA and has been authorized for detection and/or diagnosis of SARS-CoV-2 by FDA under an Emergency Use Authorization (EUA). This EUA will remain in effect (meaning this test can be used) for the duration of the COVID-19 declaration under Section 564(b)(1) of the Act, 21 U.S.C. section 360bbb-3(b)(1), unless the authorization is terminated or revoked.  Performed at [facility], [address] [place], [place] [address]          Radiology Studies: No results found.      Scheduled Meds:  vitamin C  250 mg Oral Daily   calcium-vitamin D  1 tablet Oral Q breakfast   donepezil  10 mg Oral QHS   enoxaparin (LOVENOX) injection  30 mg Subcutaneous Q24H   feeding supplement  237 mL Oral TID BM   insulin aspart  0-5 Units Subcutaneous QHS   insulin aspart  0-9 Units Subcutaneous TID WC   megestrol  200 mg Oral BID   multivitamin with minerals  1 tablet Oral Daily   pantoprazole  80 mg Oral Daily   rosuvastatin  5 mg Oral Daily   vitamin B-12  1,000 mcg Oral Daily   zinc sulfate  220 mg Oral Daily   Continuous Infusions:     LOS: 6 days    Time spent: 38 minutes spent on chart review, discussion with nursing staff, consultants, updating family and interview/physical exam; more than 50% of that time was spent in counseling and/or coordination of care.    Millicent Blazejewski J [place] DO [facility] Available via Epic secure chat 7am-7pm After these  hours, please refer to coverage provider listed on [email or web address] 06/19/2022, 1:31 PM

## 2022-06-19 NOTE — Progress Notes (Signed)
Occupational Therapy Treatment Patient Details Name: Pam Boone MRN: [id number] DOB: 25-Jun-1933 Today's Date: 06/19/2022   History of present illness Pam Boone is a 88 y.o. female with past medical history significant for dementia, essential hypertension, HLD, moderate protein calorie malnutrition, vertigo, throat cancer s/p radiation who initially presented to [facility] ED from home due to confusion, poor oral intake and intermittent fevers.  Family reports that she lives alone but they check on her occasionally throughout the week. They were planning on moving her to an assisted living facility [facility]) in the near future.  They noted that she has not been eating or drinking well over the last 2 weeks, was prior utilizing boost shakes. Found to be COVID positive and admitted.   OT comments  Patient progressing and showed improved ability to stand at sink for 2 grooming tasks with need of 1 sitting rest break of ~2 min in between and a longer sitting rest to ~5 min after 2nd standing task, compared to previous session when pt tolerated seated ADLs only. Pt required Moderate assist for supine<>sit and Min-Mod As for sit to stand transfers from EOB and from recliner.  Pt's vitals were stable with SpO2 98% and HR in the 80s. Patient remains limited by weakness with voice and cough, generalized weakness and decreased activity tolerance along with deficits noted below. Once back in supine pt was encouraged to try singing familiar songs outloud to work on voice/diaphragm strength and pt reported that she will try. Pt continues to demonstrate fair rehab potential and would benefit from continued skilled OT to increase safety and independence with ADLs and functional transfers to allow pt to return home safely and reduce caregiver burden and fall risk.  ?    Recommendations for follow up therapy are one component of a multi-disciplinary discharge planning process, led by the attending physician.   Recommendations may be updated based on patient status, additional functional criteria and insurance authorization.    Follow Up Recommendations  Skilled nursing-short term rehab (<3 hours/day)    Assistance Recommended at Discharge Frequent or constant Supervision/Assistance  Patient can return home with the following  A lot of help with bathing/dressing/bathroom;Assistance with cooking/housework;Help with stairs or ramp for entrance;Direct supervision/assist for medications management;Direct supervision/assist for financial management;Assist for transportation;A little help with walking and/or transfers   Equipment Recommendations  Other (comment) (TBD)    Recommendations for Other Services      Precautions / Restrictions Precautions Precautions: Fall Precaution Comments: daughter reports she's had 1 fall in the past 6 months, it occured while going up steps to enter her home Restrictions Weight Bearing Restrictions: No       Mobility Bed Mobility Overal bed mobility: Needs Assistance Bed Mobility: Supine to Sit, Sit to Supine     Supine to sit: Mod assist, HOB elevated Sit to supine: Mod assist   General bed mobility comments: assist to raise trunk via HHA, scoot hips EOB via pad    Transfers                         Balance Overall balance assessment: Needs assistance Sitting-balance support: No upper extremity supported, Feet supported Sitting balance-Leahy Scale: Fair     Standing balance support: During functional activity, Reliant on assistive device for balance Standing balance-Leahy Scale: Poor  ADL either performed or assessed with clinical judgement   ADL Overall ADL's : Needs assistance/impaired     Grooming: Standing;Wash/dry face;Oral care;Brushing hair;Sitting;Minimal assistance;Set up Grooming Details (indicate cue type and reason): Pt stood at sink x 2 with sitting rest break in between each task.  Stood and Washed face, sat then brushed hair and sat. Pt then asked to brush teeth while seated and did so with setup. Min As for balance while standing at sink with cues for RW placement.  Min As to complete hair brushing in back.                 Toilet Transfer: Minimal assistance;Rolling walker (2 wheels) Toilet Transfer Details (indicate cue type and reason): Pt stood from EOB to RW x1 and from recliner to RW x 3 with cues for hand and foot position and Min Assist. Pt took pivoting steps ~4 from EOB to recliner and recliner back to EOB. Recliner brought close to sink to allow pt to engage in grooming without overfatiguing.   Toileting - Clothing Manipulation Details (indicate cue type and reason): On pure wick     Functional mobility during ADLs: Minimal assistance;Rolling walker (2 wheels)      Extremity/Trunk Assessment Upper Extremity Assessment Upper Extremity Assessment: Generalized weakness   Lower Extremity Assessment Lower Extremity Assessment: Generalized weakness   Cervical / Trunk Assessment Cervical / Trunk Assessment: Kyphotic    Vision   Vision Assessment?: No apparent visual deficits   Perception     Praxis      Cognition Arousal/Alertness: Awake/alert Behavior During Therapy: WFL for tasks assessed/performed Overall Cognitive Status: History of cognitive impairments - at baseline                                 General Comments: Able to follow commands and expressed an RPE of ~5/10 after transitioning from EOB to recliner. SpO2: 98%, HR: 87 during rest break.        Exercises      Shoulder Instructions       General Comments      Pertinent Vitals/ Pain       Pain Assessment Pain Assessment: No/denies pain  Home Living                                          Prior Functioning/Environment              Frequency  Min 2X/week        Progress Toward Goals  OT Goals(current goals can now be  found in the care plan section)  Progress towards OT goals: Progressing toward goals  Acute Rehab OT Goals Patient Stated Goal: Get to work OT Goal Formulation: With patient/family Time For Goal Achievement: 06/27/22 Potential to Achieve Goals: [place] Discharge plan remains appropriate    Co-evaluation                 AM-PAC OT "6 Clicks" Daily Activity     Outcome Measure   Help from another person eating meals?: A Little Help from another person taking care of personal grooming?: A Little Help from another person toileting, which includes using toliet, bedpan, or urinal?: Total Help from another person bathing (including washing, rinsing, drying)?: A Lot Help from another person to put on and taking off  regular upper body clothing?: A Little Help from another person to put on and taking off regular lower body clothing?: A Lot 6 Click Score: 14    End of Session Equipment Utilized During Treatment: Gait belt;Rolling walker (2 wheels)  OT Visit Diagnosis: Muscle weakness (generalized) (M62.81)   Activity Tolerance Patient tolerated treatment well   Patient Left in chair;with call bell/phone within reach   Nurse Communication Mobility status        Time: 2979-8921 OT Time Calculation (min): 38 min  Charges: OT General Charges $OT Visit: 1 Visit OT Treatments $Self Care/Home Management : 23-37 mins $Therapeutic Activity: 8-22 mins  Anderson [place], OT Acute Rehab Services Office: [phone] 06/19/2022  Julien Girt 06/19/2022, 3:28 PM

## 2022-06-20 DIAGNOSIS — U071 COVID-19: Secondary | ICD-10-CM | POA: Diagnosis not present

## 2022-06-20 LAB — CBC
HCT: 36.3 % (ref 36.0–46.0)
Hemoglobin: 12.2 g/dL (ref 12.0–15.0)
MCH: 31.5 pg (ref 26.0–34.0)
MCHC: 33.6 g/dL (ref 30.0–36.0)
MCV: 93.8 fL (ref 80.0–100.0)
Platelets: 249 10*3/uL (ref 150–400)
RBC: 3.87 MIL/uL (ref 3.87–5.11)
RDW: 13.7 % (ref 11.5–15.5)
WBC: 4.8 10*3/uL (ref 4.0–10.5)
nRBC: 0 % (ref 0.0–0.2)

## 2022-06-20 LAB — BASIC METABOLIC PANEL
Anion gap: 7 (ref 5–15)
BUN: 27 mg/dL — ABNORMAL HIGH (ref 8–23)
CO2: 26 mmol/L (ref 22–32)
Calcium: 8.6 mg/dL — ABNORMAL LOW (ref 8.9–10.3)
Chloride: 99 mmol/L (ref 98–111)
Creatinine, Ser: 0.51 mg/dL (ref 0.44–1.00)
GFR, Estimated: >60 mL/min (ref >60)
Glucose, Bld: 93 mg/dL (ref 70–99)
Potassium: 4.6 mmol/L (ref 3.5–5.1)
Sodium: 132 mmol/L — ABNORMAL LOW (ref 135–145)

## 2022-06-20 LAB — GLUCOSE, CAPILLARY
Glucose-Capillary: 225 mg/dL — ABNORMAL HIGH (ref 70–99)
Glucose-Capillary: 117 mg/dL — ABNORMAL HIGH (ref 70–99)
Glucose-Capillary: 165 mg/dL — ABNORMAL HIGH (ref 70–99)
Glucose-Capillary: 159 mg/dL — ABNORMAL HIGH (ref 70–99)

## 2022-06-20 LAB — MAGNESIUM: Magnesium: 2.2 mg/dL (ref 1.7–2.4)

## 2022-06-20 NOTE — TOC Progression Note (Addendum)
Transition of Care [facility]) - Progression Note    Patient Details  Name: Pam Boone MRN: [id number] Date of Birth: [birth year]  Transition of Care [facility]) CM/SW [place], LCSW Phone Number: 06/20/2022, 9:54 AM  Clinical Narrative:    Spoke with pt's family and reviewed bed offers for SNF placement. Pt's family accepted bed offer for [facility] for Friday, 06/22/22. Insurance authorization has been started.   Update 1530: Pt's family viewed facility and changed mind on which facility they would like pt to discharge to. Pt's family have now accepted bed offer for [facility]. Insurance authorization has been started for this SNF.   Update 1610: Insurance Josem Kaufmann has been approved. ID# [id number] and next review date will be 06/25/22.   Expected Discharge Plan: Skilled Nursing Facility Barriers to Discharge: SNF Covid  Expected Discharge Plan and Services Expected Discharge Plan: [place] In-house Referral: Clinical Social Work, Hospice / Palliative Care Discharge Planning Services: CM Consult Post Acute Care Choice: [place] Living arrangements for the past 2 months: Single Family Home                 DME Arranged: N/A DME Agency: NA                   Social Determinants of Health (SDOH) Interventions    Readmission Risk Interventions     No data to display

## 2022-06-20 NOTE — Progress Notes (Signed)
PROGRESS NOTE    Pam Boone  LFY:[id number] DOB: 08-22-1933 DOA: 06/11/2022 PCP: Cathlean Sauer, MD   Brief Narrative:  88 y.o. female with past medical history significant for dementia, essential hypertension, HLD, moderate protein calorie malnutrition, vertigo, throat cancer s/p radiation presented with confusion, poor oral intake and intermittent fevers.  She was found to be positive for COVID-19.  Chest x-ray showed no acute cardiopulmonary disease, noted chronic lung disease with hyperinflation and scarring.  Family refused antibiotic treatment.  PT recommended SNF placement.  She is waiting to complete her 10-day course of isolation.  Assessment & Plan:   COVID-19 viral infection -Tested positive on 06/11/2022.  Chest x-ray showed no acute cardiopulmonary process.  No oxygen requirement.  Family declined antiviral treatment  -Continue supportive care.  Continue isolation: Complete 10-day isolation till 06/21/2022  Hyponatremia -Possibly from poor oral intake.  Improving.  Encourage oral intake  Dysphagia -Diet as per SLP recommendations.  Sepsis, ruled out -Initially started on broad-spectrum antibiotics but subsequently discontinued.  Fevers likely secondary to COVID-19 viral infection.  Currently afebrile and hemodynamically stable.  Essential hypertension -Blood pressure stable.  Continue to hold antihypertensives for now.  Dementia -Continue home donepezil and melatonin.  Fall and delirium precautions  Physical deconditioning -PT recommended SNF placement.  TOC following.  Moderate protein calorie malnutrition Adult failure to thrive Goals of care -Follow nutrition recommendations.  Consult palliative care for goals of care discussion   DVT prophylaxis: Lovenox Code Status: DNR Family Communication: None at bedside Disposition Plan: Status is: Inpatient Remains inpatient appropriate because: Of need for SNF placement    Consultants: None  Procedures:  None  Antimicrobials:  Anti-infectives (From admission, onward)    Start     Dose/Rate Route Frequency Ordered Stop   06/13/22 2200  vancomycin (VANCOREADY) IVPB 750 mg/150 mL  Status:  Discontinued        750 mg 150 mL/hr over 60 Minutes Intravenous Every 48 hours 06/11/22 1656 06/13/22 1120   06/12/22 0600  ceFEPIme (MAXIPIME) 1 g in sodium chloride 0.9 % 100 mL IVPB  Status:  Discontinued        1 g 200 mL/hr over 30 Minutes Intravenous Every 12 hours 06/11/22 1656 06/11/22 1657   06/11/22 1700  ceFEPIme (MAXIPIME) 2 g in sodium chloride 0.9 % 100 mL IVPB  Status:  Discontinued        2 g 200 mL/hr over 30 Minutes Intravenous Every 24 hours 06/11/22 1657 06/13/22 1120   06/11/22 1645  ceFEPIme (MAXIPIME) 2 g in sodium chloride 0.9 % 100 mL IVPB  Status:  Discontinued        2 g 200 mL/hr over 30 Minutes Intravenous  Once 06/11/22 1632 06/11/22 1657   06/11/22 1645  metroNIDAZOLE (FLAGYL) IVPB 500 mg        500 mg 100 mL/hr over 60 Minutes Intravenous  Once 06/11/22 1632 06/11/22 1754   06/11/22 1645  vancomycin (VANCOCIN) IVPB 1000 mg/200 mL premix        1,000 mg 200 mL/hr over 60 Minutes Intravenous  Once 06/11/22 1632 06/11/22 1754        Subjective: Patient seen and examined at bedside.  Denies any overnight fever, nausea, vomiting or worsening shortness of breath.  Objective: Vitals:   06/19/22 0600 06/19/22 1301 06/19/22 2138 06/20/22 0611  BP: (!) 141/73 135/79 122/71 127/69  Pulse: 77 95 76 68  Resp: (!) 23 (!) 22    Temp: 97.9 F (36.6 C) 97.6 F (36.4  C) 98.6 F (37 C) 97.6 F (36.4 C)  TempSrc: Oral Oral Oral Oral  SpO2: 98% 98% 98% 98%  Weight:      Height:        Intake/Output Summary (Last 24 hours) at 06/20/2022 1139 Last data filed at 06/20/2022 0853 Gross per 24 hour  Intake 680 ml  Output 1850 ml  Net -1170 ml   Filed Weights   06/11/22 1558 06/11/22 1622 06/17/22 2055  Weight: 36.7 kg 37.7 kg 38.4 kg    Examination:  General exam:  Appears calm and comfortable.  Currently on room air.  Poor historian.  Slow to respond. Respiratory system: Bilateral decreased breath sounds at bases with some scattered crackles, intermittently tachypneic Cardiovascular system: S1 & S2 heard, Rate controlled Gastrointestinal system: Abdomen is nondistended, soft and nontender. Normal bowel sounds heard. Extremities: No cyanosis, clubbing; trace lower extremity edema   Data Reviewed: I have personally reviewed following labs and imaging studies  CBC: Recent Labs  Lab 06/20/22 0628  WBC 4.8  HGB 12.2  HCT 36.3  MCV 93.8  PLT 426   Basic Metabolic Panel: Recent Labs  Lab 06/14/22 0609 06/15/22 0749 06/16/22 0715 06/19/22 0531 06/20/22 0628  NA 132* 129* 130*  --  132*  K 3.0* 3.3* 4.0  --  4.6  CL 101 100 101  --  99  CO2 '25 22 22  '$ --  26  GLUCOSE 92 91 84  --  93  BUN '14 10 16  '$ --  27*  CREATININE 0.41* 0.35* 0.57 0.63 0.51  CALCIUM 7.5* 7.6* 8.2*  --  8.6*  MG 2.1 2.0  --   --  2.2   GFR: Estimated Creatinine Clearance: 30.6 mL/min (by C-G formula based on SCr of 0.51 mg/dL). Liver Function Tests: No results for input(s): "AST", "ALT", "ALKPHOS", "BILITOT", "PROT", "ALBUMIN" in the last 168 hours. No results for input(s): "LIPASE", "AMYLASE" in the last 168 hours. No results for input(s): "AMMONIA" in the last 168 hours. Coagulation Profile: No results for input(s): "INR", "PROTIME" in the last 168 hours. Cardiac Enzymes: No results for input(s): "CKTOTAL", "CKMB", "CKMBINDEX", "TROPONINI" in the last 168 hours. BNP (last 3 results) No results for input(s): "PROBNP" in the last 8760 hours. HbA1C: No results for input(s): "HGBA1C" in the last 72 hours. CBG: Recent Labs  Lab 06/19/22 0802 06/19/22 1139 06/19/22 1653 06/19/22 2134 06/20/22 0729  GLUCAP 94 164* 138* 142* 225*   Lipid Profile: No results for input(s): "CHOL", "HDL", "LDLCALC", "TRIG", "CHOLHDL", "LDLDIRECT" in the last 72 hours. Thyroid  Function Tests: No results for input(s): "TSH", "T4TOTAL", "FREET4", "T3FREE", "THYROIDAB" in the last 72 hours. Anemia Panel: No results for input(s): "VITAMINB12", "FOLATE", "FERRITIN", "TIBC", "IRON", "RETICCTPCT" in the last 72 hours. Sepsis Labs: No results for input(s): "PROCALCITON", "LATICACIDVEN" in the last 168 hours.  Recent Results (from the past 240 hour(s))  Culture, blood (Routine x 2)     Status: None   Collection Time: 06/11/22  4:00 PM   Specimen: BLOOD  Result Value Ref Range Status   Specimen Description   Final    BLOOD LEFT ANTECUBITAL Performed at [facility], [address] [place], [place] [address]    Special Requests   Final    BOTTLES DRAWN AEROBIC AND ANAEROBIC Blood Culture adequate volume Performed at [facility], [address] [place], [place] [address]    Culture   Final    NO GROWTH 5 DAYS Performed  at [facility], [place] [address], [place], [place] [address]    Report Status 06/16/2022 FINAL  Final  Culture, blood (Routine x 2)     Status: None   Collection Time: 06/11/22  4:05 PM   Specimen: BLOOD  Result Value Ref Range Status   Specimen Description   Final    BLOOD RIGHT ANTECUBITAL Performed at [facility], [address] [place], [place] [address]    Special Requests   Final    BOTTLES DRAWN AEROBIC AND ANAEROBIC Blood Culture adequate volume Performed at [facility], [address] [place], [place] [address]    Culture   Final    NO GROWTH 5 DAYS Performed at [facility], [place] [address] [place], [place] [address]    Report Status 06/16/2022 FINAL  Final  Urine Culture     Status: None   Collection Time: 06/11/22  4:47 PM   Specimen: Urine, Clean Catch  Result Value Ref Range Status   Specimen Description   Final    URINE, CLEAN CATCH Performed at [facility], [address] [place], [place] [address]    Special Requests   Final    NONE Performed  at [facility], [address] [place], [place] [address]    Culture   Final    NO GROWTH Performed at [facility], [place] [address] [place],  [address]    Report Status 06/13/2022 FINAL  Final  Resp Panel by RT-PCR (Flu A&B, Covid) Anterior Nasal Swab     Status: Abnormal   Collection Time: 06/11/22  5:02 PM   Specimen: Anterior Nasal Swab  Result Value Ref Range Status   SARS Coronavirus 2 by RT PCR POSITIVE (A) NEGATIVE Final    Comment: (NOTE) SARS-CoV-2 target nucleic acids are DETECTED.  The SARS-CoV-2 RNA is generally detectable in upper respiratory specimens during the acute phase of infection. Positive results are indicative of the presence of the identified virus, but do not rule out bacterial infection or co-infection with other pathogens not detected by the test. Clinical correlation with patient history and other diagnostic information is necessary to determine patient infection status. The expected result is Negative.  Fact Sheet for Patients: EntrepreneurPulse.com.au  Fact Sheet for Healthcare Providers: IncredibleEmployment.be  This test is not yet approved or cleared by the [place] FDA and  has been authorized for detection and/or diagnosis of SARS-CoV-2 by FDA under an Emergency Use Authorization (EUA).  This EUA will remain in effect (meaning this test can be used) for the duration of  the COVID-19 declaration under Section 564(b)(1) of the A ct, 21 U.S.C. section 360bbb-3(b)(1), unless the authorization is terminated or revoked sooner.     Influenza A by PCR NEGATIVE NEGATIVE Final   Influenza B by PCR NEGATIVE NEGATIVE Final    Comment: (NOTE) The Xpert Xpress SARS-CoV-2/FLU/RSV plus assay is intended as an aid in the diagnosis of influenza from Nasopharyngeal swab specimens and should not be used as a sole basis for treatment. Nasal washings and aspirates are unacceptable for Xpert  Xpress SARS-CoV-2/FLU/RSV testing.  Fact Sheet for Patients: EntrepreneurPulse.com.au  Fact Sheet for Healthcare Providers: IncredibleEmployment.be  This test is not yet approved or cleared by the [place] FDA and has been authorized for detection and/or diagnosis of SARS-CoV-2 by FDA under an Emergency Use Authorization (EUA). This EUA will remain in effect (meaning this test can  be used) for the duration of the COVID-19 declaration under Section 564(b)(1) of the Act, 21 U.S.C. section 360bbb-3(b)(1), unless the authorization is terminated or revoked.  Performed at [facility], [address] [place], [place] [address]          Radiology Studies: No results found.      Scheduled Meds:  vitamin C  250 mg Oral Daily   calcium-vitamin D  1 tablet Oral Q breakfast   donepezil  10 mg Oral QHS   enoxaparin (LOVENOX) injection  30 mg Subcutaneous Q24H   feeding supplement  237 mL Oral TID BM   insulin aspart  0-5 Units Subcutaneous QHS   insulin aspart  0-9 Units Subcutaneous TID WC   megestrol  200 mg Oral BID   multivitamin with minerals  1 tablet Oral Daily   pantoprazole  80 mg Oral Daily   rosuvastatin  5 mg Oral Daily   vitamin B-12  1,000 mcg Oral Daily   zinc sulfate  220 mg Oral Daily   Continuous Infusions:        Aline August, MD [facility] 06/20/2022, 11:39 AM

## 2022-06-20 NOTE — Plan of Care (Signed)
No acute events this shift.  Problem: Education: Goal: Knowledge of General Education information will improve Description: Including pain rating scale, medication(s)/side effects and non-pharmacologic comfort measures Outcome: Progressing   Problem: Health Behavior/Discharge Planning: Goal: Ability to manage health-related needs will improve Outcome: Progressing   Problem: Clinical Measurements: Goal: Ability to maintain clinical measurements within normal limits will improve Outcome: Progressing   Problem: Activity: Goal: Risk for activity intolerance will decrease Outcome: Progressing   Problem: Nutrition: Goal: Adequate nutrition will be maintained Outcome: Progressing   Problem: Elimination: Goal: Will not experience complications related to bowel motility Outcome: Progressing Goal: Will not experience complications related to urinary retention Outcome: Progressing   Problem: Safety: Goal: Ability to remain free from injury will improve Outcome: Progressing   Problem: Skin Integrity: Goal: Risk for impaired skin integrity will decrease Outcome: Progressing   Problem: Education: Goal: Ability to describe self-care measures that may prevent or decrease complications (Diabetes Survival Skills Education) will improve Outcome: Progressing Goal: Individualized Educational Video(s) Outcome: Progressing   Problem: Coping: Goal: Ability to adjust to condition or change in health will improve Outcome: Progressing   Problem: Fluid Volume: Goal: Ability to maintain a balanced intake and output will improve Outcome: Progressing   Problem: Health Behavior/Discharge Planning: Goal: Ability to identify and utilize available resources and services will improve Outcome: Progressing Goal: Ability to manage health-related needs will improve Outcome: Progressing   Problem: Metabolic: Goal: Ability to maintain appropriate glucose levels will improve Outcome: Progressing    Problem: Nutritional: Goal: Maintenance of adequate nutrition will improve Outcome: Progressing Goal: Progress toward achieving an optimal weight will improve Outcome: Progressing   Problem: Skin Integrity: Goal: Risk for impaired skin integrity will decrease Outcome: Progressing   Problem: Tissue Perfusion: Goal: Adequacy of tissue perfusion will improve Outcome: Progressing   Problem: Education: Goal: Knowledge of risk factors and measures for prevention of condition will improve Outcome: Progressing   Problem: Coping: Goal: Psychosocial and spiritual needs will be supported Outcome: Progressing   Problem: Respiratory: Goal: Will maintain a patent airway Outcome: Progressing Goal: Complications related to the disease process, condition or treatment will be avoided or minimized Outcome: Progressing

## 2022-06-21 DIAGNOSIS — U071 COVID-19: Secondary | ICD-10-CM | POA: Diagnosis not present

## 2022-06-21 LAB — GLUCOSE, CAPILLARY
Glucose-Capillary: 95 mg/dL (ref 70–99)
Glucose-Capillary: 143 mg/dL — ABNORMAL HIGH (ref 70–99)

## 2022-06-21 NOTE — Progress Notes (Signed)
Report called to Aleda Grana, LPN @ [facility]. No questions at this time. Patient being transported by [facility]

## 2022-06-21 NOTE — Progress Notes (Signed)
[id number]) Hospital Liaison note:  Notified via Joes from Dr. Aline August of request for Oriental services. Will continue to follow for disposition.  Please call with any outpatient palliative questions or concerns.  Thank you for the opportunity to participate in this patient's care.  Thank you, Lorelee Market, LPN [facility] Liaison [phone]

## 2022-06-21 NOTE — Discharge Summary (Signed)
Physician Discharge Summary  Pam Boone KWI:[id number] DOB: [birth year] DOA: 06/11/2022  PCP: Cathlean Sauer, MD  Admit date: 06/11/2022 Discharge date: 06/21/2022  Admitted From: Home Disposition: SNF  Recommendations for Outpatient Follow-up:  Follow up with SNF provider at earliest convenience Outpatient follow-up with palliative care for goals of care discussion Follow up in ED if symptoms worsen or new appear   Home Health: No Equipment/Devices: None  Discharge Condition: Stable CODE STATUS: Full Diet recommendation: Heart healthy/as per SLP recommendations Diet recommendations: Nectar-thick liquid;Thin liquid;Dysphagia 3 (mechanical soft) Liquids provided via: Straw;Teaspoon Medication Administration: Crushed with puree (or crushed) Supervision: Full supervision/cueing for compensatory strategies;Trained caregiver to feed patient Compensations: Slow rate;Small sips/bites;Chin tuck Postural Changes and/or Swallow Maneuvers: Seated upright 90 degrees;Upright 30-60 min after meal;Head turn right during swallow;Chin tuck  Brief/Interim Summary: 88 y.o. female with past medical history significant for dementia, essential hypertension, HLD, moderate protein calorie malnutrition, vertigo, throat cancer s/p radiation presented with confusion, poor oral intake and intermittent fevers.  She was found to be positive for COVID-19.  Chest x-ray showed no acute cardiopulmonary disease, noted chronic lung disease with hyperinflation and scarring.  Family refused antibiotic treatment.  PT recommended SNF placement.  She has completed 10-day course of isolation today.  She will be discharged to SNF once bed is available.    Discharge Diagnoses:  COVID-19 viral infection -Tested positive on 06/11/2022.  Chest x-ray showed no acute cardiopulmonary process.  No oxygen requirement.  Family declined antiviral treatment  -Continue supportive care.  Completed 10-day isolation today.  Discharge to SNF once  bed is available.    Hyponatremia -Possibly from poor oral intake.  Improving.  Encourage oral intake.  Monitor intermittently.  Dysphagia -Diet as per SLP recommendations.   Sepsis, ruled out -Initially started on broad-spectrum antibiotics but subsequently discontinued.  Fevers likely secondary to COVID-19 viral infection.  Currently afebrile and hemodynamically stable.   Essential hypertension -Blood pressure intermittently on the lower side.  Resume metoprolol on discharge.  Hold amlodipine and losartan.  Outpatient follow-up.  Dementia -Continue home donepezil.  Outpatient follow-up.  Physical deconditioning -PT recommended SNF placement.     Moderate protein calorie malnutrition Adult failure to thrive Goals of care -Follow nutrition recommendations.  Palliative care consultation is pending.  This can happen as an outpatient.  Discharge Instructions   Allergies as of 06/21/2022       Reactions   Bactrim [sulfamethoxazole-trimethoprim] Rash        Medication List     STOP taking these medications    amLODipine 5 MG tablet Commonly known as: NORVASC   buPROPion 150 MG 24 hr tablet Commonly known as: WELLBUTRIN XL   losartan 100 MG tablet Commonly known as: COZAAR   potassium chloride 10 MEQ tablet Commonly known as: KLOR-CON M       TAKE these medications    donepezil 10 MG tablet Commonly known as: ARICEPT Take 10 mg by mouth at bedtime.   meclizine 12.5 MG tablet Commonly known as: ANTIVERT Take 12.5 mg by mouth daily as needed for dizziness.   megestrol 400 MG/10ML suspension Commonly known as: MEGACE Take 200 mg by mouth 2 (two) times daily.   metoprolol succinate 25 MG 24 hr tablet Commonly known as: TOPROL-XL Take 12.5 mg by mouth daily.   omeprazole 40 MG capsule Commonly known as: PRILOSEC Take 40 mg by mouth daily.   rosuvastatin 5 MG tablet Commonly known as: CRESTOR Take 5 mg by mouth daily.   vitamin B-12  1000 MCG  tablet Commonly known as: CYANOCOBALAMIN Take 1,000 mcg by mouth daily.         Contact information for after-discharge care     Destination     [facility] .   Service: Skilled Nursing Contact information: [place] [address] [phone]                    Allergies  Allergen Reactions   Bactrim [Sulfamethoxazole-Trimethoprim] Rash    Consultations: Palliative care consultation pending.   Procedures/Studies: DG Swallowing Func-Speech Pathology  Result Date: 06/13/2022 Table formatting from the original result was not included. Objective Swallowing Evaluation: Type of Study: MBS-Modified Barium Swallow Study  Patient Details Name: Pam Boone MRN: [id number] Date of Birth: Jan 25, 1933 Today's Date: 06/13/2022 Time: SLP Start Time (ACUTE ONLY): 1400 -SLP Stop Time (ACUTE ONLY): 1445 SLP Time Calculation (min) (ACUTE ONLY): 45 min Past Medical History: Past Medical History: Diagnosis Date  Hypertension  Past Surgical History: Past Surgical History: Procedure Laterality Date  WHIPPLE PROCEDURE   HPI: Pam Boone is a 88 y.o. female with medical history significant for dementia, essential hypertension, hyperlipidemia, moderate protein calorie malnutrition, vertigo, throat cancer s/p radiation, who presented to [facility] ED from home due to confusion, poor oral intake, and intermittent fevers.  She tested positive for COVID-19 in the ED.  Her chest x-ray was nonacute.  .  However she was tachypneic and febrile with Tmax of 101.8.  Code sepsis was called in the ED.  Blood cultures were obtained and she was started on broad-spectrum IV antibiotics empirically, cefepime and IV vancomycin. Son-in-law present and advised that pt has had 8 pound weight loss over the last 3 weeks - and was not able to walk to bathroom 2 days ago.  Swallow evaluation ordered.  Per son-in-law, pt chronically clears her throat when she eats/drinks and has had  decreased intake.  She is followed by oncology at [facility]. 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  Pt also with h/o Nissen per son.  Subjective: pt awake in bed, daughter present  Recommendations for follow up therapy are one component of a multi-disciplinary discharge planning process, led by the attending physician.  Recommendations may be updated based on patient status, additional functional criteria and insurance authorization. Assessment / Plan / Recommendation   06/13/2022   3:49 PM Clinical Impressions Clinical Impression Patient with mild oral and moderately severe pharyngeal dysphagia. Oral deficits c/b decreased coordination with oral transiting resulting prolonged oral transiting with solids/lingual pumping with puree and inconsistent premature spillage of liquids into pharynx.  Pharyngeal swallow marked by hypomotility resulting in impaired tongue base retraction, epiglottic deflection and compromised laryngeal closure. Delay in swallow reflex with swallow triggering with solids/puree over epiglottis and spilling to pyriform sinus is precarious.  Suspect iatrogenic impacts of XRT as primary source of pt's dysphagia.  Pharyngeal retention observed across consistencies - more at vallecular space than pyriform sinuses. Patient appears with anterior curvature of cervical spine narrowing pharynx/area between epiglottis and posterior pharynx that may also contribute to dysphagia.  She does not sense retention and was not able to cough/expectorate per verbal and auditory cue.    Pt with chronic aspiration of secretions *observed as they mixed with barium* without airway clearance despite reflexive throat clearing and cough.  She aspirated a moderate amount of thin liquids as laryngeal  closure was compromised and barium spilled into open airway.  Cough was reflexively more pronounced  with aspiration of larger amount of thin but still ineffective to clear.  Head turn to the right with slight chin down posture significantly diminished accumulation of retention at vallecular space.     Recommend pt have full liquid diet *nectar via straw and thin via tsp* with strict precautions - including head turn right with slight chin down posture.   Advised daughter and pt to recommendations and concerns that dysphagia has contributed to weight loss. Also reviewed that patient's dysphagia likely has impacted her nutrition and also will be an aspiration risk.  Encouraged meeting with palliative to help establish this patient's GOC and advised to speak to MD re: code status due to level of dysphagia  SLP Visit Diagnosis Dysphagia, pharyngeal phase (R13.13);Dysphagia, pharyngoesophageal phase (R13.14);Dysphagia, unspecified (R13.10) Impact on safety and function Moderate aspiration risk;Risk for inadequate nutrition/hydration;Other (comment)     06/13/2022   3:49 PM Treatment Recommendations Treatment Recommendations Therapy as outlined in treatment plan below     06/13/2022   4:01 PM Prognosis Prognosis for Safe Diet Advancement Guarded Barriers to Reach Goals Severity of deficits;Time post onset   06/13/2022   3:49 PM Diet Recommendations Medication Administration Crushed with puree Compensations Slow rate;Small sips/bites;Multiple dry swallows after each bite/sip     06/13/2022   3:49 PM Other Recommendations Other Recommendations Have oral suction available Follow Up Recommendations Follow physician's recommendations for discharge plan and follow up therapies Assistance recommended at discharge Frequent or constant Supervision/Assistance Functional Status Assessment Patient has had a recent decline in their functional status and/or demonstrates limited ability to make significant improvements in function in a reasonable and predictable amount of time   06/13/2022   3:49 PM Frequency and Duration  Speech Therapy  Frequency (ACUTE ONLY) min 2x/week Treatment Duration 2 weeks     06/13/2022   3:41 PM Oral Phase Oral Phase Impaired    06/13/2022   3:41 PM Pharyngeal Phase Pharyngeal Phase Impaired Pharyngeal- Nectar Teaspoon Reduced pharyngeal peristalsis;Reduced epiglottic inversion;Reduced anterior laryngeal mobility;Reduced laryngeal elevation;Reduced airway/laryngeal closure;Reduced tongue base retraction;Pharyngeal residue - valleculae;Pharyngeal residue - pyriform;Delayed swallow initiation-vallecula Pharyngeal Material does not enter airway Pharyngeal- Nectar Straw Reduced pharyngeal peristalsis;Reduced epiglottic inversion;Reduced laryngeal elevation;Reduced anterior laryngeal mobility;Reduced airway/laryngeal closure;Reduced tongue base retraction;Pharyngeal residue - valleculae;Delayed swallow initiation-vallecula Pharyngeal Material does not enter airway Pharyngeal- Thin Teaspoon Reduced pharyngeal peristalsis;Reduced epiglottic inversion;Reduced anterior laryngeal mobility;Reduced laryngeal elevation;Reduced airway/laryngeal closure;Reduced tongue base retraction;Pharyngeal residue - valleculae Pharyngeal Material does not enter airway Pharyngeal- Thin Cup Reduced pharyngeal peristalsis;Reduced epiglottic inversion;Reduced anterior laryngeal mobility;Reduced laryngeal elevation;Reduced airway/laryngeal closure;Reduced tongue base retraction;Moderate aspiration;Penetration/Aspiration during swallow;Penetration/Apiration after swallow;Pharyngeal residue - valleculae;Penetration/Aspiration before swallow Pharyngeal Material enters airway, passes BELOW cords and not ejected out despite cough attempt by patient Pharyngeal- Thin Straw Reduced pharyngeal peristalsis;Reduced epiglottic inversion;Reduced anterior laryngeal mobility;Reduced laryngeal elevation;Reduced airway/laryngeal closure;Reduced tongue base retraction;Penetration/Aspiration during swallow;Penetration/Apiration after swallow Pharyngeal Material enters airway,  remains ABOVE vocal cords and not ejected out Pharyngeal- Puree Delayed swallow initiation-vallecula;Other (Comment);Delayed swallow initiation-pyriform sinuses;Pharyngeal residue - valleculae Pharyngeal Material does not enter airway Pharyngeal- Mechanical Soft Delayed swallow initiation-vallecula;Delayed swallow initiation-pyriform sinuses;Pharyngeal residue - valleculae Pharyngeal Material does not enter airway Pharyngeal Comment head turn the right with subtle chin tuck posture helpful to decrease accumulation of barium at vallecular space; pt with some difficulty initiating swallow - most notable dry swallows; reflexive and volitonal cough were not effective to consistently clear penetration nor aspirates    06/13/2022  3:49 PM Cervical Esophageal Phase  Cervical Esophageal Phase Darryll Capers Macario Golds 06/13/2022, 4:06 PM                     DG Chest Portable 1 View  Result Date: 06/11/2022 CLINICAL DATA:  Hypotension EXAM: PORTABLE CHEST 1 VIEW COMPARISON:  09/26/2020 FINDINGS: Heart size is normal. Chronic aortic tortuosity. Chronic lung disease with hyperinflation and scarring. No sign of active infiltrate, mass, effusion or collapse. IMPRESSION: No active disease. Chronic lung disease with hyperinflation and scarring. Electronically Signed   By: Nelson Chimes M.D.   On: 06/11/2022 16:36      Subjective: Patient seen and examined at bedside.  No overnight fever, vomiting, agitation or seizures reported.  Discharge Exam: Vitals:   06/20/22 2146 06/21/22 0549  BP: (!) 148/82 130/69  Pulse: 77 65  Resp: 16 16  Temp: 97.9 F (36.6 C) (!) 97.5 F (36.4 C)  SpO2: 97% 98%    General: Pt is  awake, not in acute distress.  Elderly female lying in bed.  Currently on room air.  Slow to respond.  Poor historian. Cardiovascular: rate controlled, S1/S2 + Respiratory: bilateral decreased breath sounds at bases with some scattered crackles Abdominal: Soft, NT, ND, bowel sounds + Extremities: Trace  lower extremity no cyanosis    The results of significant diagnostics from this hospitalization (including imaging, microbiology, ancillary and laboratory) are listed below for reference.     Microbiology: Recent Results (from the past 240 hour(s))  Culture, blood (Routine x 2)     Status: None   Collection Time: 06/11/22  4:00 PM   Specimen: BLOOD  Result Value Ref Range Status   Specimen Description   Final    BLOOD LEFT ANTECUBITAL Performed at [facility], [address] [place], [place] [address]    Special Requests   Final    BOTTLES DRAWN AEROBIC AND ANAEROBIC Blood Culture adequate volume Performed at [facility], [address] [place], [place] [address]    Culture   Final    NO GROWTH 5 DAYS Performed at [facility], [place] [address], [place], [place] [address]    Report Status 06/16/2022 FINAL  Final  Culture, blood (Routine x 2)     Status: None   Collection Time: 06/11/22  4:05 PM   Specimen: BLOOD  Result Value Ref Range Status   Specimen Description   Final    BLOOD RIGHT ANTECUBITAL Performed at [facility], [address] [place], [place] [address]    Special Requests   Final    BOTTLES DRAWN AEROBIC AND ANAEROBIC Blood Culture adequate volume Performed at [facility], [address] [place], [place] [address]    Culture   Final    NO GROWTH 5 DAYS Performed at [facility], [place] [address] [place], [place] [address]    Report Status 06/16/2022 FINAL  Final  Urine Culture     Status: None   Collection Time: 06/11/22  4:47 PM   Specimen: Urine, Clean Catch  Result Value Ref Range Status   Specimen Description   Final    URINE, CLEAN CATCH Performed at [facility] [address] [place], [place] [address]    Special Requests   Final    NONE Performed at [facility], [address] [place], [place] [address]    Culture  Final    NO GROWTH Performed at [facility], [place] [address], [place], [place] [address]    Report Status 06/13/2022 FINAL  Final  Resp Panel by RT-PCR (Flu A&B, Covid) Anterior Nasal Swab     Status: Abnormal   Collection Time: 06/11/22  5:02 PM   Specimen: Anterior Nasal Swab  Result Value Ref Range Status   SARS Coronavirus 2 by RT PCR POSITIVE (A) NEGATIVE Final    Comment: (NOTE) SARS-CoV-2 target nucleic acids are DETECTED.  The SARS-CoV-2 RNA is generally detectable in upper respiratory specimens during the acute phase of infection. Positive results are indicative of the presence of the identified virus, but do not rule out bacterial infection or co-infection with other pathogens not detected by the test. Clinical correlation with patient history and other diagnostic information is necessary to determine patient infection status. The expected result is Negative.  Fact Sheet for Patients: EntrepreneurPulse.com.au  Fact Sheet for Healthcare Providers: IncredibleEmployment.be  This test is not yet approved or cleared by the [place] FDA and  has been authorized for detection and/or diagnosis of SARS-CoV-2 by FDA under an Emergency Use Authorization (EUA).  This EUA will remain in effect (meaning this test can be used) for the duration of  the COVID-19 declaration under Section 564(b)(1) of the A ct, 21 U.S.C. section 360bbb-3(b)(1), unless the authorization is terminated or revoked sooner.     Influenza A by PCR NEGATIVE NEGATIVE Final   Influenza B by PCR NEGATIVE NEGATIVE Final    Comment: (NOTE) The Xpert Xpress SARS-CoV-2/FLU/RSV plus assay is intended as an aid in the diagnosis of influenza from Nasopharyngeal swab specimens and should not be used as a sole basis for treatment. Nasal washings and aspirates are unacceptable for Xpert Xpress SARS-CoV-2/FLU/RSV testing.  Fact Sheet for Patients: EntrepreneurPulse.com.au  Fact Sheet for  Healthcare Providers: IncredibleEmployment.be  This test is not yet approved or cleared by the [place] FDA and has been authorized for detection and/or diagnosis of SARS-CoV-2 by FDA under an Emergency Use Authorization (EUA). This EUA will remain in effect (meaning this test can be used) for the duration of the COVID-19 declaration under Section 564(b)(1) of the Act, 21 U.S.C. section 360bbb-3(b)(1), unless the authorization is terminated or revoked.  Performed at [facility], [address] [place], [place] [address]      Labs: BNP (last 3 results) No results for input(s): "BNP" in the last 8760 hours. Basic Metabolic Panel: Recent Labs  Lab 06/15/22 0749 06/16/22 0715 06/19/22 0531 06/20/22 0628  NA 129* 130*  --  132*  K 3.3* 4.0  --  4.6  CL 100 101  --  99  CO2 22 22  --  26  GLUCOSE 91 84  --  93  BUN 10 16  --  27*  CREATININE 0.35* 0.57 0.63 0.51  CALCIUM 7.6* 8.2*  --  8.6*  MG 2.0  --   --  2.2   Liver Function Tests: No results for input(s): "AST", "ALT", "ALKPHOS", "BILITOT", "PROT", "ALBUMIN" in the last 168 hours. No results for input(s): "LIPASE", "AMYLASE" in the last 168 hours. No results for input(s): "AMMONIA" in the last 168 hours. CBC: Recent Labs  Lab 06/20/22 0628  WBC 4.8  HGB 12.2  HCT 36.3  MCV 93.8  PLT 249   Cardiac Enzymes: No results for input(s): "CKTOTAL", "CKMB", "CKMBINDEX", "TROPONINI" in the last 168 hours. BNP: Invalid input(s): "POCBNP" CBG: Recent Labs  Lab 06/19/22 2134 06/20/22 0729 06/20/22 1149 06/20/22 1635 06/20/22 2144  GLUCAP 142* 225* 117* 165* 159*   D-Dimer No results for input(s): "DDIMER" in the last 72 hours. Hgb A1c No results for input(s): "HGBA1C" in the last 72 hours. Lipid Profile No results for input(s): "CHOL", "HDL", "LDLCALC", "TRIG", "CHOLHDL", "LDLDIRECT" in the last 72 hours. Thyroid function studies No results for input(s): "TSH", "T4TOTAL",  "T3FREE", "THYROIDAB" in the last 72 hours.  Invalid input(s): "FREET3" Anemia work up No results for input(s): "VITAMINB12", "FOLATE", "FERRITIN", "TIBC", "IRON", "RETICCTPCT" in the last 72 hours. Urinalysis    Component Value Date/Time   COLORURINE YELLOW 06/11/2022 [place] 06/11/2022 1647   LABSPEC 1.025 06/11/2022 1647   PHURINE 6.0 06/11/2022 1647   GLUCOSEU >=500 (A) 06/11/2022 1647   HGBUR TRACE (A) 06/11/2022 1647   BILIRUBINUR NEGATIVE 06/11/2022 1647   KETONESUR NEGATIVE 06/11/2022 1647   PROTEINUR 100 (A) 06/11/2022 1647   NITRITE NEGATIVE 06/11/2022 1647   LEUKOCYTESUR NEGATIVE 06/11/2022 1647   Sepsis Labs Recent Labs  Lab 06/20/22 0628  WBC 4.8   Microbiology Recent Results (from the past 240 hour(s))  Culture, blood (Routine x 2)     Status: None   Collection Time: 06/11/22  4:00 PM   Specimen: BLOOD  Result Value Ref Range Status   Specimen Description   Final    BLOOD LEFT ANTECUBITAL Performed at [facility], [address] [place], [place] [address]    Special Requests   Final    BOTTLES DRAWN AEROBIC AND ANAEROBIC Blood Culture adequate volume Performed at [facility], [address] [place], [place] [address]    Culture   Final    NO GROWTH 5 DAYS Performed at [facility], [place] [address], [place], [place] [address]    Report Status 06/16/2022 FINAL  Final  Culture, blood (Routine x 2)     Status: None   Collection Time: 06/11/22  4:05 PM   Specimen: BLOOD  Result Value Ref Range Status   Specimen Description   Final    BLOOD RIGHT ANTECUBITAL Performed at [facility], [address] [place], [place] [address]    Special Requests   Final    BOTTLES DRAWN AEROBIC AND ANAEROBIC Blood Culture adequate volume Performed at [facility], [address] [place], [place] [address]    Culture   Final    NO GROWTH 5 DAYS Performed at [facility], [place] [address] [place], [place] [address]    Report Status 06/16/2022 FINAL  Final  Urine Culture     Status: None   Collection Time: 06/11/22  4:47 PM   Specimen: Urine, Clean Catch  Result Value Ref Range Status   Specimen Description   Final    URINE, CLEAN CATCH Performed at [facility], [address] [place], [place] [address]    Special Requests   Final    NONE Performed at [facility], [address] [place], [place] [address]    Culture   Final    NO GROWTH Performed at [facility], [place] [address], [place],  [address]    Report Status 06/13/2022 FINAL  Final  Resp Panel by RT-PCR (Flu A&B, Covid) Anterior Nasal Swab     Status: Abnormal   Collection Time: 06/11/22  5:02 PM   Specimen: Anterior Nasal Swab  Result Value Ref Range Status   SARS Coronavirus 2 by RT PCR POSITIVE (A) NEGATIVE Final    Comment: (NOTE) SARS-CoV-2 target nucleic acids are DETECTED.  The SARS-CoV-2 RNA is generally detectable in upper respiratory specimens during the acute phase of infection. Positive results are indicative of the presence of the identified virus, but do not rule out bacterial infection or co-infection with other pathogens not detected by the test. Clinical correlation with patient history and other diagnostic information is necessary to determine patient infection status. The expected result is Negative.  Fact Sheet for Patients: EntrepreneurPulse.com.au  Fact Sheet for Healthcare Providers: IncredibleEmployment.be  This test is not yet approved or cleared by the [place] FDA and  has been authorized for detection and/or diagnosis of SARS-CoV-2 by FDA under an Emergency Use Authorization (EUA).  This EUA will remain in effect (meaning this test can be used) for the duration of  the COVID-19 declaration under Section 564(b)(1) of the A ct, 21 U.S.C. section 360bbb-3(b)(1), unless the authorization  is terminated or revoked sooner.     Influenza A by PCR NEGATIVE NEGATIVE Final   Influenza B by PCR NEGATIVE NEGATIVE Final    Comment: (NOTE) The Xpert Xpress SARS-CoV-2/FLU/RSV plus assay is intended as an aid in the diagnosis of influenza from Nasopharyngeal swab specimens and should not be used as a sole basis for treatment. Nasal washings and aspirates are unacceptable for Xpert Xpress SARS-CoV-2/FLU/RSV testing.  Fact Sheet for Patients: EntrepreneurPulse.com.au  Fact Sheet for Healthcare Providers: IncredibleEmployment.be  This test is not yet approved or cleared by the [place] FDA and has been authorized for detection and/or diagnosis of SARS-CoV-2 by FDA under an Emergency Use Authorization (EUA). This EUA will remain in effect (meaning this test can be used) for the duration of the COVID-19 declaration under Section 564(b)(1) of the Act, 21 U.S.C. section 360bbb-3(b)(1), unless the authorization is terminated or revoked.  Performed at [facility], [address], [place], [place] [address]      Time coordinating discharge: 35 minutes  SIGNED:   Aline August, MD  [facility] 06/21/2022, 7:36 AM

## 2022-06-21 NOTE — TOC Transition Note (Signed)
Transition of Care [facility]) - CM/SW Discharge Note   Patient Details  Name: Pam Boone MRN: [id number] Date of Birth: 1933-10-21  Transition of Care [facility]) CM/SW Contact:  Vassie Moselle, LCSW Phone Number: 06/21/2022, 10:32 AM   Clinical Narrative:    Pt is to discharge to [facility] room 808p. Nurses to call report to [phone]. Pt will be transported by [facility].    Final next level of care: Skilled Nursing Facility Barriers to Discharge: Barriers Resolved   Patient Goals and CMS Choice Patient states their goals for this hospitalization and ongoing recovery are:: To return home   Choice offered to / list presented to : Patient, Adult Children  Discharge Placement PASRR number recieved: 06/13/22            Patient [facility] bed at: [facility] Patient to be transferred to facility by: [place] Name of family member notified: [name] Patient and family notified of of transfer: 06/21/22  Discharge Plan and Services In-house Referral: Clinical Social Work, Hospice / Palliative Care Discharge Planning Services: CM Consult Post Acute Care Choice: [place]          DME Arranged: N/A DME Agency: NA                  Social Determinants of Health (SDOH) Interventions     Readmission Risk Interventions     No data to display

## 2022-06-21 NOTE — Progress Notes (Signed)
Physical Therapy Treatment Patient Details Name: Pam Boone MRN: [id number] DOB: Feb 10, 1933 Today's Date: 06/21/2022   History of Present Illness Pam Boone is a 88 y.o. female with past medical history significant for dementia, essential hypertension, HLD, moderate protein calorie malnutrition, vertigo, throat cancer s/p radiation who initially presented to [facility] ED from home due to confusion, poor oral intake and intermittent fevers.  Family reports that she lives alone but they check on her occasionally throughout the week. They were planning on moving her to an assisted living facility [facility]) in the near future.  They noted that she has not been eating or drinking well over the last 2 weeks, was prior utilizing boost shakes. Found to be COVID positive and admitted.    PT Comments    Pt semi-reclined in recliner upon entry agreeable to be seen. son-in-law Pam Boone present. Pt required min assist for transfers and in-room ambulation for two bouts of 87f, VSS. Pt demonstrated fatigue and increased weakness from prior sessions. During ambulation pt demonstrated better safety and mobility with 2HHA vs RW, HHA safer choice for mobility moving forward during acute stay. Encouraged pt to walk with staff outside of session to tolerance, verbalized understanding. Discharge destination remains appropriate, we will continue to follow acutely.   Recommendations for follow up therapy are one component of a multi-disciplinary discharge planning process, led by the attending physician.  Recommendations may be updated based on patient status, additional functional criteria and insurance authorization.  Follow Up Recommendations  Skilled nursing-short term rehab (<3 hours/day) Can patient physically be transported by private vehicle: No   Assistance Recommended at Discharge Frequent or constant Supervision/Assistance  Patient can return home with the following A lot of help with walking and/or transfers;A  lot of help with bathing/dressing/bathroom;Assistance with cooking/housework;Assist for transportation;Help with stairs or ramp for entrance   Equipment Recommendations  None recommended by PT (TBD at SNF)    Recommendations for Other Services       Precautions / Restrictions Precautions Precautions: Fall Precaution Comments: daughter reports she's had 1 fall in the past 6 months, it occured while going up steps to enter her home Restrictions Weight Bearing Restrictions: No     Mobility  Bed Mobility Overal bed mobility: Needs Assistance         Sit to supine: Mod assist   General bed mobility comments: Mod assist to adjust trunk and bring BLE into bed    Transfers Overall transfer level: Needs assistance Equipment used: Rolling walker (2 wheels), 2 person hand held assist Transfers: Sit to/from Stand Sit to Stand: Min assist           General transfer comment: Pt required min assist for lift assist and balance (demonstrated posterior lean). Pt completed transfer once with RW and once with 2[facility] pt performed better, safer transfer with 2[facility] No overt LOB noted.    Ambulation/Gait Ambulation/Gait assistance: Min assist Gait Distance (Feet): 20 Feet Assistive device: Rolling walker (2 wheels), 2 person hand held assist Gait Pattern/deviations: Step-through pattern, Decreased stride length, Trunk flexed, Narrow base of support, Decreased step length - right, Decreased step length - left, Decreased dorsiflexion - right Gait velocity: decreased     General Gait Details: Pt ambulated with RW and min assist for steadying 135f then seated rest break in recliner. Demonstrated unfamiliarity with RW and attempted to ambulate out of RW, required multimodal cueing and adjustment of RW into path. SEcond bout of 1045ft ambulated [facility] mob[facility] with increased stability  and familiarity. Throughout entirity of ambulation pt demonstrated decreased ability to progress R foot,  required scooting via PT foot occasionally. Family member providing verbal encouragement.   Stairs             Wheelchair Mobility    Modified Rankin (Stroke Patients Only)       Balance Overall balance assessment: Needs assistance Sitting-balance support: No upper extremity supported, Feet supported Sitting balance-Leahy Scale: Fair     Standing balance support: During functional activity, Reliant on assistive device for balance Standing balance-Leahy Scale: Poor                              Cognition Arousal/Alertness: Awake/alert Behavior During Therapy: WFL for tasks assessed/performed Overall Cognitive Status: History of cognitive impairments - at baseline                                 General Comments: Able to follow commands SpO2: 97%, HR: 76 during rest break.        Exercises      General Comments        Pertinent Vitals/Pain Pain Assessment Breathing: normal Negative Vocalization: none Facial Expression: smiling or inexpressive Body Language: relaxed Consolability: no need to console PAINAD Score: 0    Home Living                          Prior Function            PT Goals (current goals can now be found in the care plan section) Acute Rehab PT Goals Patient Stated Goal: SNF per daughter PT Goal Formulation: With patient Time For Goal Achievement: 06/28/22 Potential to Achieve Goals: Fair Progress towards PT goals: Progressing toward goals    Frequency    Min 2X/week      PT Plan Current plan remains appropriate    Co-evaluation              AM-PAC PT "6 Clicks" Mobility   Outcome Measure  Help needed turning from your back to your side while in a flat bed without using bedrails?: A Little Help needed moving from lying on your back to sitting on the side of a flat bed without using bedrails?: A Lot Help needed moving to and from a bed to a chair (including a wheelchair)?: A  Lot Help needed standing up from a chair using your arms (e.g., wheelchair or bedside chair)?: A Lot Help needed to walk in hospital room?: Total Help needed climbing 3-5 steps with a railing? : Total 6 Click Score: 11    End of Session Equipment Utilized During Treatment: Gait belt Activity Tolerance: Patient limited by fatigue Patient left: in bed;with bed alarm set;with family/visitor present Nurse Communication: Mobility status PT Visit Diagnosis: Difficulty in walking, not elsewhere classified (R26.2);Other abnormalities of gait and mobility (R26.89)     Time: 5102-5852 PT Time Calculation (min) (ACUTE ONLY): 30 min  Charges:  $Gait Training: 23-37 mins                     Coolidge Breeze, PT, DPT [facility] Office: [phone] Pager: [phone]   Coolidge Breeze 06/21/2022, 11:51 AM

## 2022-10-10 IMAGING — CR DG CHEST 2V
2 series · 2 of 2 positions shown · non-contrast
Comparison: 01/19/2020

CLINICAL DATA: Chest pain

EXAM:
CHEST - 2 VIEW

[w chest pa]
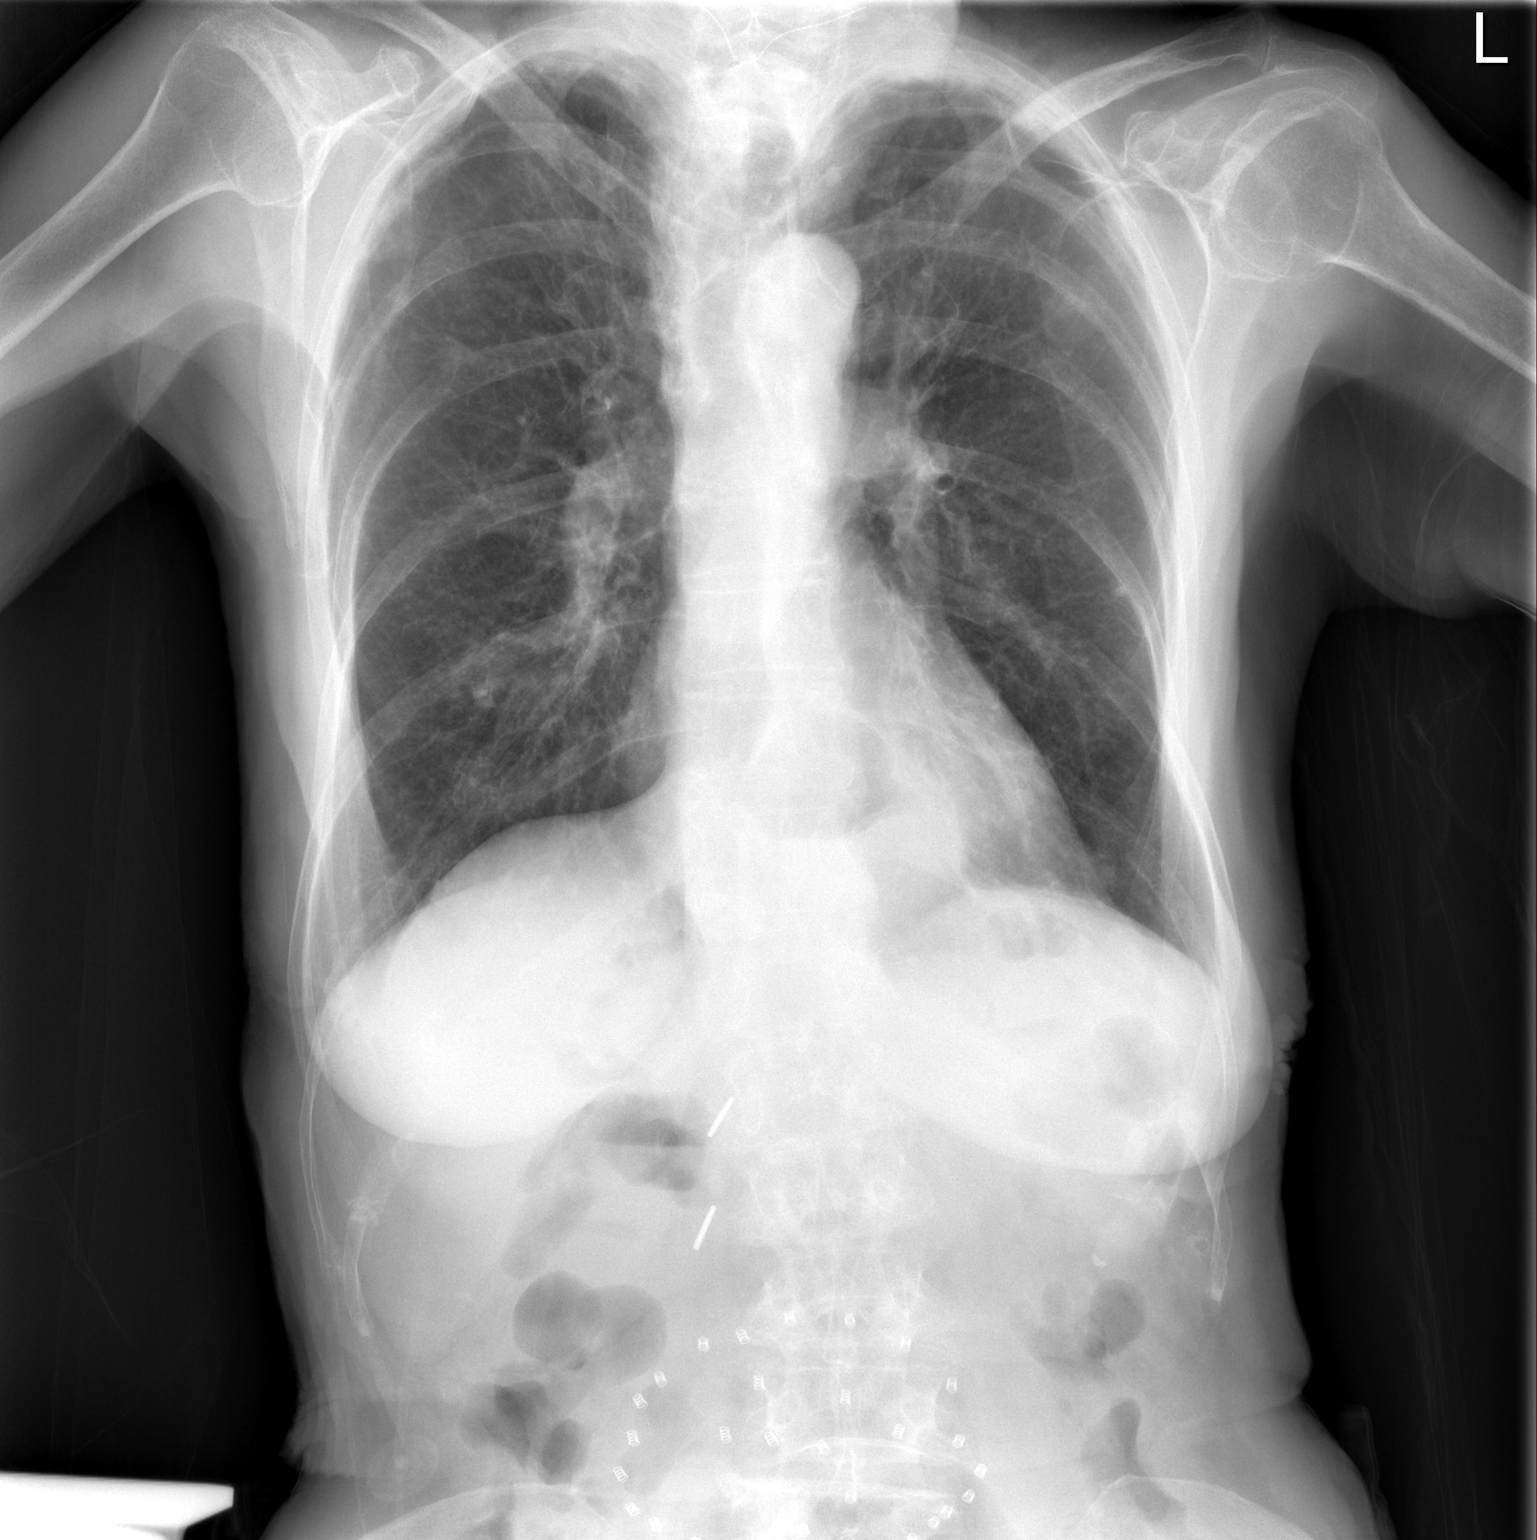

[w chest lat]
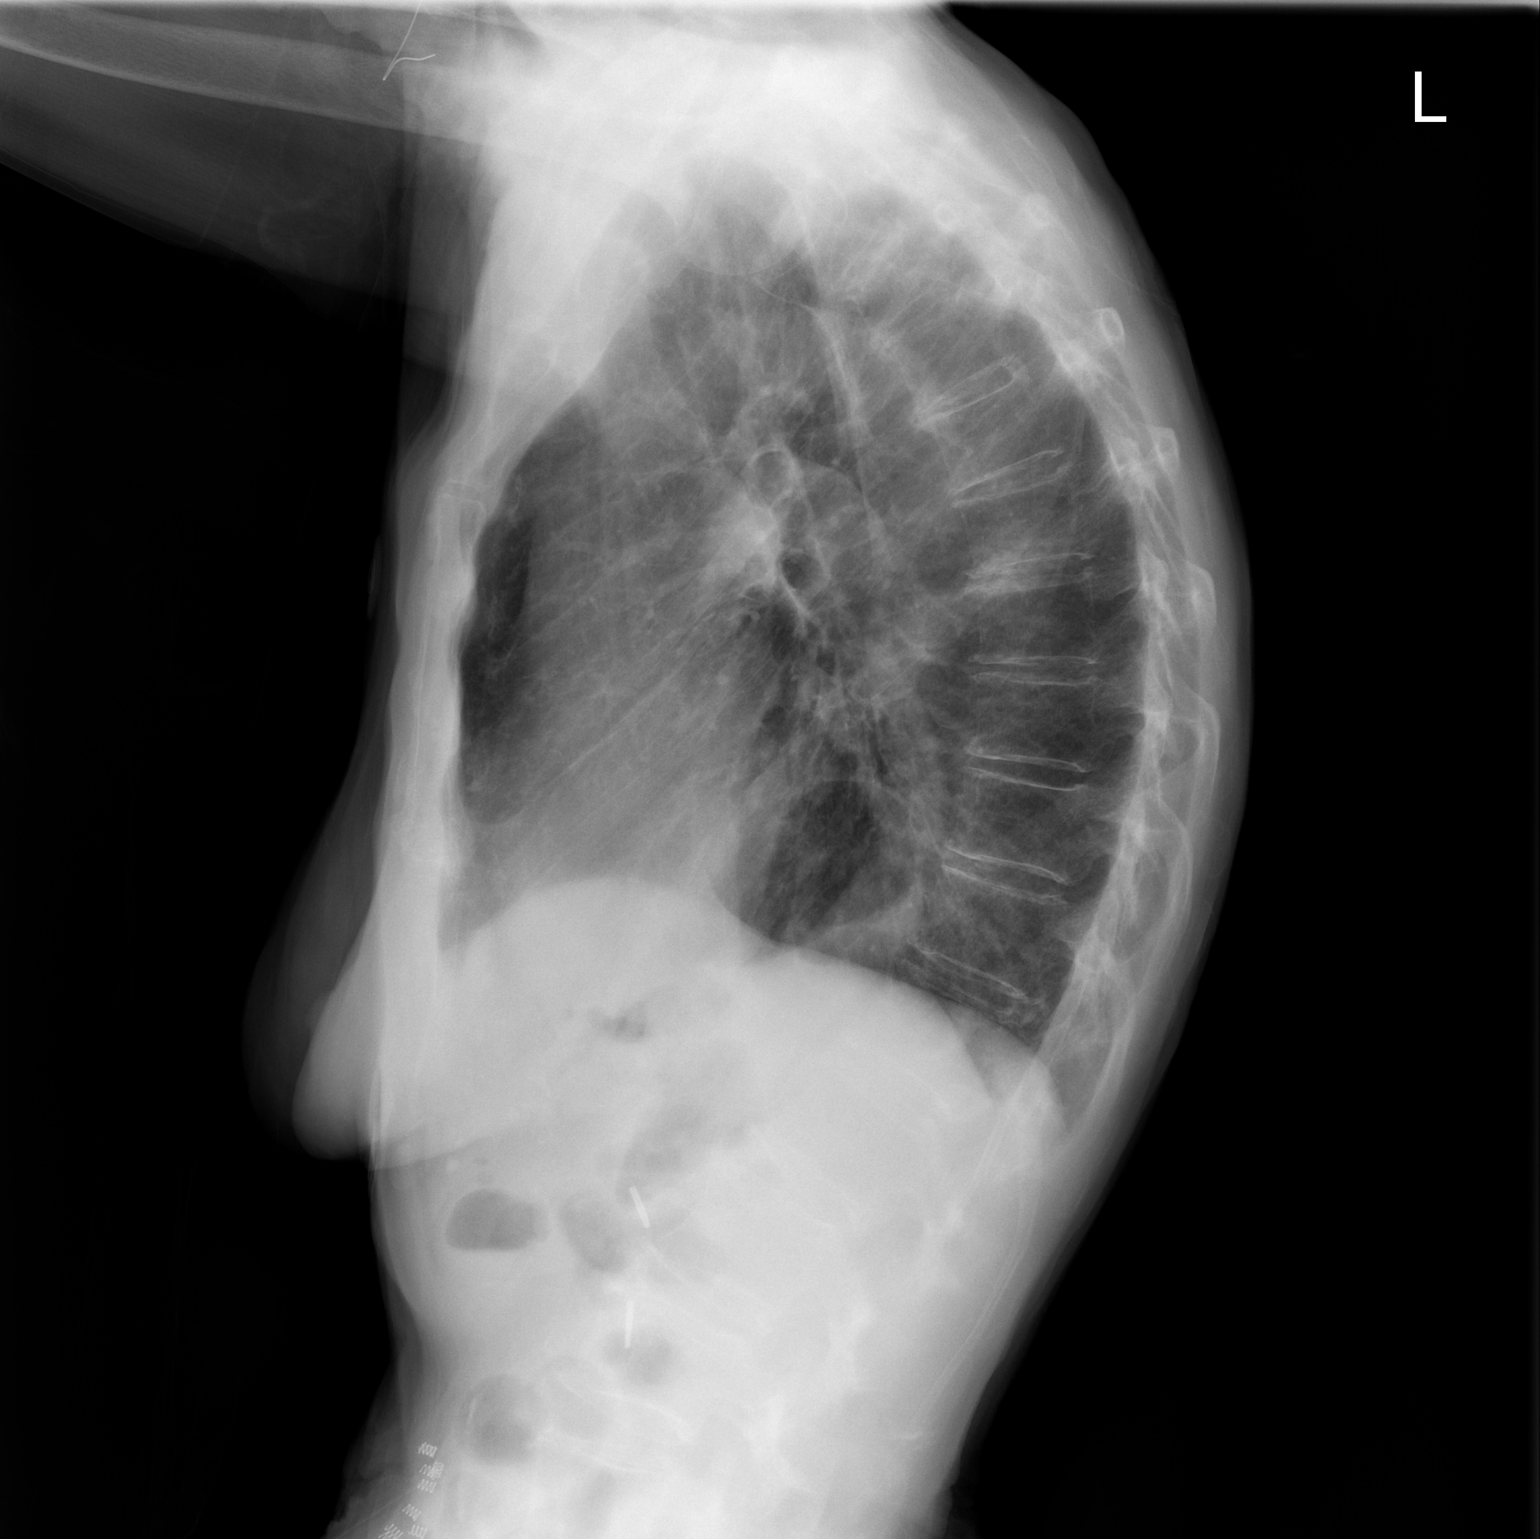

[2 of 2 positions shown; findings below may reference images not displayed]

FINDINGS: The heart size and mediastinal contours are within normal limits. No
consolidation. Similar nodular opacity in the right upper lobe.
Chronic hyperinflation. No visible pleural effusions or
pneumothorax. Biapical pleuroparenchymal scarring. Redemonstrated
calcified granuloma in the left lung. Moderate hiatal hernia. No
acute osseous abnormality. Degenerative changes of the spine.
Osteopenia.
IMPRESSION: 1. No acute cardiopulmonary abnormality.
2. Chronic hyperinflation.
3. Moderate hiatal hernia.

## 2022-10-10 DEATH — deceased
